# Patient Record
Sex: Male | Born: 1950 | Race: White | Hispanic: No | Smoking: Former smoker
Health system: Southern US, Community
[De-identification: ages and names within clinical notes are randomized; demographics above are authoritative.]

## PROBLEM LIST (undated history)

## (undated) DIAGNOSIS — I2510 Atherosclerotic heart disease of native coronary artery without angina pectoris: Secondary | ICD-10-CM

## (undated) DIAGNOSIS — I472 Ventricular tachycardia, unspecified: Secondary | ICD-10-CM

## (undated) DIAGNOSIS — F439 Reaction to severe stress, unspecified: Secondary | ICD-10-CM

## (undated) DIAGNOSIS — I259 Chronic ischemic heart disease, unspecified: Secondary | ICD-10-CM

## (undated) DIAGNOSIS — F419 Anxiety disorder, unspecified: Secondary | ICD-10-CM

## (undated) DIAGNOSIS — Z720 Tobacco use: Secondary | ICD-10-CM

## (undated) DIAGNOSIS — Z8679 Personal history of other diseases of the circulatory system: Secondary | ICD-10-CM

## (undated) DIAGNOSIS — E785 Hyperlipidemia, unspecified: Secondary | ICD-10-CM

## (undated) DIAGNOSIS — I10 Essential (primary) hypertension: Secondary | ICD-10-CM

## (undated) HISTORY — DX: Tobacco use: Z72.0

## (undated) HISTORY — DX: Ventricular tachycardia, unspecified: I47.20

## (undated) HISTORY — DX: Anxiety disorder, unspecified: F41.9

## (undated) HISTORY — DX: Reaction to severe stress, unspecified: F43.9

## (undated) HISTORY — DX: Hyperlipidemia, unspecified: E78.5

## (undated) HISTORY — DX: Ventricular tachycardia: I47.2

## (undated) HISTORY — DX: Essential (primary) hypertension: I10

## (undated) HISTORY — DX: Atherosclerotic heart disease of native coronary artery without angina pectoris: I25.10

## (undated) HISTORY — DX: Personal history of other diseases of the circulatory system: Z86.79

## (undated) HISTORY — DX: Chronic ischemic heart disease, unspecified: I25.9

---

## 1993-02-18 HISTORY — PX: OTHER SURGICAL HISTORY: SHX169

## 1994-02-18 HISTORY — PX: CARDIAC CATHETERIZATION: SHX172

## 2002-02-18 HISTORY — PX: CARDIAC CATHETERIZATION: SHX172

## 2002-10-04 ENCOUNTER — Ambulatory Visit (HOSPITAL_COMMUNITY): Admission: RE | Admit: 2002-10-04 | Discharge: 2002-10-05 | Payer: Self-pay | Admitting: Cardiology

## 2010-08-07 ENCOUNTER — Encounter: Payer: Self-pay | Admitting: *Deleted

## 2010-08-13 ENCOUNTER — Ambulatory Visit: Payer: Self-pay | Admitting: Nurse Practitioner

## 2010-08-13 ENCOUNTER — Other Ambulatory Visit: Payer: Self-pay | Admitting: *Deleted

## 2010-09-03 ENCOUNTER — Other Ambulatory Visit: Payer: Self-pay | Admitting: Cardiology

## 2010-09-03 MED ORDER — CITALOPRAM HYDROBROMIDE 20 MG PO TABS: 20.0000 mg | ORAL_TABLET | Freq: Every day | ORAL | Status: DC

## 2010-09-03 NOTE — Telephone Encounter (Signed)
Phone number disconnected, wrote on script to make app. 30 day given/Jodette Warden/ranger

## 2010-10-15 ENCOUNTER — Other Ambulatory Visit: Payer: Self-pay | Admitting: *Deleted

## 2010-10-15 MED ORDER — SIMVASTATIN 40 MG PO TABS: 40.0000 mg | ORAL_TABLET | Freq: Every day | ORAL | Status: DC

## 2010-10-15 MED ORDER — ATENOLOL 25 MG PO TABS: 25.0000 mg | ORAL_TABLET | Freq: Every day | ORAL | Status: DC

## 2010-10-15 MED ORDER — CITALOPRAM HYDROBROMIDE 20 MG PO TABS: 20.0000 mg | ORAL_TABLET | Freq: Every day | ORAL | Status: DC

## 2010-10-15 NOTE — Telephone Encounter (Signed)
lori np said to give 15 till he makes an app for f/u

## 2010-11-05 ENCOUNTER — Telehealth: Payer: Self-pay | Admitting: *Deleted

## 2010-11-05 NOTE — Telephone Encounter (Signed)
CALLED PHARM AND DECLINED REFILLS PER Corky Sox NP

## 2012-03-20 ENCOUNTER — Encounter (INDEPENDENT_AMBULATORY_CARE_PROVIDER_SITE_OTHER): Payer: BC Managed Care – PPO | Admitting: Ophthalmology

## 2012-03-20 DIAGNOSIS — H43819 Vitreous degeneration, unspecified eye: Secondary | ICD-10-CM

## 2012-03-20 DIAGNOSIS — H2510 Age-related nuclear cataract, unspecified eye: Secondary | ICD-10-CM

## 2012-03-20 DIAGNOSIS — H353290 Exudative age-related macular degeneration, unspecified eye, stage unspecified: Secondary | ICD-10-CM

## 2012-04-17 ENCOUNTER — Encounter (INDEPENDENT_AMBULATORY_CARE_PROVIDER_SITE_OTHER): Payer: BC Managed Care – PPO | Admitting: Ophthalmology

## 2012-04-17 DIAGNOSIS — H353290 Exudative age-related macular degeneration, unspecified eye, stage unspecified: Secondary | ICD-10-CM

## 2012-04-17 DIAGNOSIS — H2510 Age-related nuclear cataract, unspecified eye: Secondary | ICD-10-CM

## 2012-04-17 DIAGNOSIS — H43819 Vitreous degeneration, unspecified eye: Secondary | ICD-10-CM

## 2012-05-14 ENCOUNTER — Encounter (INDEPENDENT_AMBULATORY_CARE_PROVIDER_SITE_OTHER): Payer: BC Managed Care – PPO | Admitting: Ophthalmology

## 2012-05-14 DIAGNOSIS — H353290 Exudative age-related macular degeneration, unspecified eye, stage unspecified: Secondary | ICD-10-CM

## 2012-05-14 DIAGNOSIS — H43819 Vitreous degeneration, unspecified eye: Secondary | ICD-10-CM

## 2012-05-14 DIAGNOSIS — H2510 Age-related nuclear cataract, unspecified eye: Secondary | ICD-10-CM

## 2012-06-12 ENCOUNTER — Encounter (INDEPENDENT_AMBULATORY_CARE_PROVIDER_SITE_OTHER): Payer: BC Managed Care – PPO | Admitting: Ophthalmology

## 2012-06-12 DIAGNOSIS — H2510 Age-related nuclear cataract, unspecified eye: Secondary | ICD-10-CM

## 2012-06-12 DIAGNOSIS — H43819 Vitreous degeneration, unspecified eye: Secondary | ICD-10-CM

## 2012-06-12 DIAGNOSIS — H353290 Exudative age-related macular degeneration, unspecified eye, stage unspecified: Secondary | ICD-10-CM

## 2012-07-14 ENCOUNTER — Encounter (INDEPENDENT_AMBULATORY_CARE_PROVIDER_SITE_OTHER): Payer: BC Managed Care – PPO | Admitting: Ophthalmology

## 2012-07-17 ENCOUNTER — Encounter (INDEPENDENT_AMBULATORY_CARE_PROVIDER_SITE_OTHER): Payer: BC Managed Care – PPO | Admitting: Ophthalmology

## 2012-07-22 ENCOUNTER — Encounter (INDEPENDENT_AMBULATORY_CARE_PROVIDER_SITE_OTHER): Payer: BC Managed Care – PPO | Admitting: Ophthalmology

## 2012-07-31 ENCOUNTER — Encounter (INDEPENDENT_AMBULATORY_CARE_PROVIDER_SITE_OTHER): Payer: BC Managed Care – PPO | Admitting: Ophthalmology

## 2012-08-06 ENCOUNTER — Encounter (INDEPENDENT_AMBULATORY_CARE_PROVIDER_SITE_OTHER): Payer: BC Managed Care – PPO | Admitting: Ophthalmology

## 2012-08-17 ENCOUNTER — Encounter (INDEPENDENT_AMBULATORY_CARE_PROVIDER_SITE_OTHER): Payer: BC Managed Care – PPO | Admitting: Ophthalmology

## 2012-08-24 ENCOUNTER — Encounter (INDEPENDENT_AMBULATORY_CARE_PROVIDER_SITE_OTHER): Payer: BC Managed Care – PPO | Admitting: Ophthalmology

## 2012-08-28 ENCOUNTER — Encounter (INDEPENDENT_AMBULATORY_CARE_PROVIDER_SITE_OTHER): Payer: BC Managed Care – PPO | Admitting: Ophthalmology

## 2015-07-19 ENCOUNTER — Other Ambulatory Visit: Payer: Self-pay | Admitting: Acute Care

## 2015-07-19 DIAGNOSIS — F17210 Nicotine dependence, cigarettes, uncomplicated: Secondary | ICD-10-CM

## 2015-07-31 ENCOUNTER — Telehealth: Payer: Self-pay | Admitting: Acute Care

## 2015-07-31 NOTE — Telephone Encounter (Signed)
Patient is deferring on the lung cancer screening due to his high deductible cost. Appointments have been cancelled.Please re-refer once he has met his deductible. Thanks so much.

## 2015-07-31 NOTE — Telephone Encounter (Signed)
I checked with the patient's insurance today per his request after I got the CT approved to find out just how much it was going to cost him. The insurance would pay 80% after he had met his deductible which is 1,000.00. At this point he has not met any of his deductible and declined both appointments with Eric Form and the CT. The CT appt has been cancelled and I will cancel Sarah's appt also

## 2015-08-14 ENCOUNTER — Inpatient Hospital Stay: Admission: RE | Admit: 2015-08-14 | Payer: Self-pay | Source: Ambulatory Visit

## 2015-08-14 ENCOUNTER — Encounter: Payer: Self-pay | Admitting: Acute Care

## 2016-02-20 DIAGNOSIS — I10 Essential (primary) hypertension: Secondary | ICD-10-CM | POA: Diagnosis not present

## 2016-02-20 DIAGNOSIS — E785 Hyperlipidemia, unspecified: Secondary | ICD-10-CM | POA: Diagnosis not present

## 2016-03-04 DIAGNOSIS — I10 Essential (primary) hypertension: Secondary | ICD-10-CM | POA: Diagnosis not present

## 2016-03-04 DIAGNOSIS — M79674 Pain in right toe(s): Secondary | ICD-10-CM | POA: Diagnosis not present

## 2016-03-04 DIAGNOSIS — E785 Hyperlipidemia, unspecified: Secondary | ICD-10-CM | POA: Diagnosis not present

## 2016-03-12 DIAGNOSIS — I10 Essential (primary) hypertension: Secondary | ICD-10-CM | POA: Diagnosis not present

## 2016-03-12 DIAGNOSIS — E785 Hyperlipidemia, unspecified: Secondary | ICD-10-CM | POA: Diagnosis not present

## 2016-05-07 DIAGNOSIS — I10 Essential (primary) hypertension: Secondary | ICD-10-CM | POA: Diagnosis not present

## 2016-05-07 DIAGNOSIS — E785 Hyperlipidemia, unspecified: Secondary | ICD-10-CM | POA: Diagnosis not present

## 2016-08-08 DIAGNOSIS — I10 Essential (primary) hypertension: Secondary | ICD-10-CM | POA: Diagnosis not present

## 2016-08-08 DIAGNOSIS — E785 Hyperlipidemia, unspecified: Secondary | ICD-10-CM | POA: Diagnosis not present

## 2016-08-08 DIAGNOSIS — E790 Hyperuricemia without signs of inflammatory arthritis and tophaceous disease: Secondary | ICD-10-CM | POA: Diagnosis not present

## 2016-10-16 DIAGNOSIS — H2513 Age-related nuclear cataract, bilateral: Secondary | ICD-10-CM | POA: Diagnosis not present

## 2016-11-11 DIAGNOSIS — E790 Hyperuricemia without signs of inflammatory arthritis and tophaceous disease: Secondary | ICD-10-CM | POA: Diagnosis not present

## 2016-11-11 DIAGNOSIS — E785 Hyperlipidemia, unspecified: Secondary | ICD-10-CM | POA: Diagnosis not present

## 2016-11-11 DIAGNOSIS — I10 Essential (primary) hypertension: Secondary | ICD-10-CM | POA: Diagnosis not present

## 2016-11-11 DIAGNOSIS — Z23 Encounter for immunization: Secondary | ICD-10-CM | POA: Diagnosis not present

## 2016-11-12 DIAGNOSIS — E785 Hyperlipidemia, unspecified: Secondary | ICD-10-CM | POA: Diagnosis not present

## 2017-03-14 DIAGNOSIS — I10 Essential (primary) hypertension: Secondary | ICD-10-CM | POA: Diagnosis not present

## 2017-03-14 DIAGNOSIS — E785 Hyperlipidemia, unspecified: Secondary | ICD-10-CM | POA: Diagnosis not present

## 2017-03-14 DIAGNOSIS — Z125 Encounter for screening for malignant neoplasm of prostate: Secondary | ICD-10-CM | POA: Diagnosis not present

## 2017-03-14 DIAGNOSIS — E790 Hyperuricemia without signs of inflammatory arthritis and tophaceous disease: Secondary | ICD-10-CM | POA: Diagnosis not present

## 2017-09-16 DIAGNOSIS — Z23 Encounter for immunization: Secondary | ICD-10-CM | POA: Diagnosis not present

## 2017-09-16 DIAGNOSIS — Z119 Encounter for screening for infectious and parasitic diseases, unspecified: Secondary | ICD-10-CM | POA: Diagnosis not present

## 2017-09-16 DIAGNOSIS — E790 Hyperuricemia without signs of inflammatory arthritis and tophaceous disease: Secondary | ICD-10-CM | POA: Diagnosis not present

## 2017-09-16 DIAGNOSIS — E785 Hyperlipidemia, unspecified: Secondary | ICD-10-CM | POA: Diagnosis not present

## 2017-09-16 DIAGNOSIS — I10 Essential (primary) hypertension: Secondary | ICD-10-CM | POA: Diagnosis not present

## 2017-10-01 DIAGNOSIS — Z23 Encounter for immunization: Secondary | ICD-10-CM | POA: Diagnosis not present

## 2017-10-01 DIAGNOSIS — Z0000 Encounter for general adult medical examination without abnormal findings: Secondary | ICD-10-CM | POA: Diagnosis not present

## 2017-11-11 ENCOUNTER — Other Ambulatory Visit: Payer: Self-pay | Admitting: Acute Care

## 2017-11-11 DIAGNOSIS — F17210 Nicotine dependence, cigarettes, uncomplicated: Secondary | ICD-10-CM

## 2017-11-11 DIAGNOSIS — Z122 Encounter for screening for malignant neoplasm of respiratory organs: Secondary | ICD-10-CM

## 2017-11-21 ENCOUNTER — Ambulatory Visit (INDEPENDENT_AMBULATORY_CARE_PROVIDER_SITE_OTHER)
Admission: RE | Admit: 2017-11-21 | Discharge: 2017-11-21 | Disposition: A | Discharge status: Home / Self Care | Payer: 59 | Source: Ambulatory Visit | Attending: Acute Care | Admitting: Acute Care

## 2017-11-21 ENCOUNTER — Encounter: Payer: Self-pay | Admitting: Acute Care

## 2017-11-21 ENCOUNTER — Ambulatory Visit (INDEPENDENT_AMBULATORY_CARE_PROVIDER_SITE_OTHER): Payer: 59 | Admitting: Acute Care

## 2017-11-21 DIAGNOSIS — F17210 Nicotine dependence, cigarettes, uncomplicated: Secondary | ICD-10-CM | POA: Diagnosis not present

## 2017-11-21 DIAGNOSIS — Z122 Encounter for screening for malignant neoplasm of respiratory organs: Secondary | ICD-10-CM

## 2017-11-21 NOTE — Progress Notes (Signed)
Shared Decision Making Visit Lung Cancer Screening Program [phone])   Eligibility:  Age 67 y.o.  Pack Years Smoking History Calculation 37 pack year smoking history (# packs/per year x # years smoked)  Recent History of coughing up blood  no  Unexplained weight loss? no ( >Than 15 pounds within the last 6 months )  Prior History Lung / other cancer no (Diagnosis within the last 5 years already requiring surveillance chest CT Scans).  Smoking Status Current Smoker  Former Smokers: Years since quit: NA  Quit Date: NA  Visit Components:  Discussion included one or more decision making aids. yes  Discussion included risk/benefits of screening. yes  Discussion included potential follow up diagnostic testing for abnormal scans. yes  Discussion included meaning and risk of over diagnosis. yes  Discussion included meaning and risk of False Positives. yes  Discussion included meaning of total radiation exposure. yes  Counseling Included:  Importance of adherence to annual lung cancer LDCT screening. yes  Impact of comorbidities on ability to participate in the program. yes  Ability and willingness to under diagnostic treatment. yes  Smoking Cessation Counseling:  Current Smokers:   Discussed importance of smoking cessation. yes  Information about tobacco cessation classes and interventions provided to patient. yes  Patient provided with "ticket" for LDCT Scan. yes  Symptomatic Patient. no  Counseling  Diagnosis Code: Tobacco Use Z72.0  Asymptomatic Patient yes  Counseling (Intermediate counseling: > three minutes counseling) U0454  Former Smokers:   Discussed the importance of maintaining cigarette abstinence. yes  Diagnosis Code: Personal History of Nicotine Dependence. U98.119  Information about tobacco cessation classes and interventions provided to patient. Yes  Patient provided with "ticket" for LDCT Scan. yes  Written Order for Lung Cancer  Screening with LDCT placed in Epic. Yes (CT Chest Lung Cancer Screening Low Dose W/O CM) [id number] Z12.2-Screening of respiratory organs Z87.891-Personal history of nicotine dependence  I have spent 25 minutes of face to face time with Mr. Mays discussing the risks and benefits of lung cancer screening. We viewed a power point together that explained in detail the above noted topics. We paused at intervals to allow for questions to be asked and answered to ensure understanding.We discussed that the single most powerful action that he can take to decrease his risk of developing lung cancer is to quit smoking. We discussed whether or not he is ready to commit to setting a quit date. We discussed options for tools to aid in quitting smoking including nicotine replacement therapy, non-nicotine medications, support groups, Quit Smart classes, and behavior modification. We discussed that often times setting smaller, more achievable goals, such as eliminating 1 cigarette a day for a week and then 2 cigarettes a day for a week can be helpful in slowly decreasing the number of cigarettes smoked. This allows for a sense of accomplishment as well as providing a clinical benefit. I gave him the " Be Stronger Than Your Excuses" card with contact information for community resources, classes, free nicotine replacement therapy, and access to mobile apps, text messaging, and on-line smoking cessation help. I have also given him my card and contact information in the event he needs to contact me. We discussed the time and location of the scan, and that either Abigail Miyamoto RN or I will call with the results within 24-48 hours of receiving them. I have offered him  a copy of the power point we viewed  as a resource in the event they need reinforcement of  the concepts we discussed today in the office. The patient verbalized understanding of all of  the above and had no further questions upon leaving the office. They have my  contact information in the event they have any further questions.  I spent 4 minutes counseling on smoking cessation and the health risks of continued tobacco abuse.  I explained to the patient that there has been a high incidence of coronary artery disease noted on these exams. I explained that this is a non-gated exam therefore degree or severity cannot be determined. This patient is on statin therapy. I have asked the patient to follow-up with their PCP regarding any incidental finding of coronary artery disease and management with diet or medication as their PCP  feels is clinically indicated. The patient verbalized understanding of the above and had no further questions upon completion of the visit.      Bevelyn Ngo, NP 11/21/2017 3:30 PM

## 2017-11-28 NOTE — Progress Notes (Signed)
Called spoke with patient, advised of LDCT results / recs as stated by Maralyn Sago NP.  Pt verbalized understanding and denied any questions.  Results faxed to PCP.  Angelique Blonder to order LDCT in 11/2018.

## 2017-12-02 ENCOUNTER — Other Ambulatory Visit: Payer: Self-pay | Admitting: Acute Care

## 2017-12-02 DIAGNOSIS — Z122 Encounter for screening for malignant neoplasm of respiratory organs: Secondary | ICD-10-CM

## 2017-12-02 DIAGNOSIS — F17210 Nicotine dependence, cigarettes, uncomplicated: Secondary | ICD-10-CM

## 2017-12-02 DIAGNOSIS — Z87891 Personal history of nicotine dependence: Secondary | ICD-10-CM

## 2017-12-08 DIAGNOSIS — H2513 Age-related nuclear cataract, bilateral: Secondary | ICD-10-CM | POA: Diagnosis not present

## 2018-03-27 DIAGNOSIS — E785 Hyperlipidemia, unspecified: Secondary | ICD-10-CM | POA: Diagnosis not present

## 2018-03-27 DIAGNOSIS — I10 Essential (primary) hypertension: Secondary | ICD-10-CM | POA: Diagnosis not present

## 2018-03-27 DIAGNOSIS — E790 Hyperuricemia without signs of inflammatory arthritis and tophaceous disease: Secondary | ICD-10-CM | POA: Diagnosis not present

## 2018-03-27 DIAGNOSIS — Z125 Encounter for screening for malignant neoplasm of prostate: Secondary | ICD-10-CM | POA: Diagnosis not present

## 2018-04-03 DIAGNOSIS — Z1211 Encounter for screening for malignant neoplasm of colon: Secondary | ICD-10-CM | POA: Diagnosis not present

## 2018-06-08 DIAGNOSIS — H2513 Age-related nuclear cataract, bilateral: Secondary | ICD-10-CM | POA: Diagnosis not present

## 2018-11-19 ENCOUNTER — Ambulatory Visit: Payer: Self-pay | Admitting: Cardiology

## 2018-11-20 ENCOUNTER — Ambulatory Visit: Payer: Medicare Other

## 2018-11-20 ENCOUNTER — Other Ambulatory Visit: Payer: Self-pay

## 2018-11-20 ENCOUNTER — Ambulatory Visit (INDEPENDENT_AMBULATORY_CARE_PROVIDER_SITE_OTHER): Payer: Medicare Other | Admitting: Cardiology

## 2018-11-20 ENCOUNTER — Encounter: Payer: Self-pay | Admitting: Cardiology

## 2018-11-20 VITALS — BP 165/82 | HR 48 | Ht 72.0 in | Wt 242.1 lb

## 2018-11-20 DIAGNOSIS — R002 Palpitations: Secondary | ICD-10-CM

## 2018-11-20 DIAGNOSIS — I2510 Atherosclerotic heart disease of native coronary artery without angina pectoris: Secondary | ICD-10-CM

## 2018-11-20 DIAGNOSIS — I10 Essential (primary) hypertension: Secondary | ICD-10-CM | POA: Diagnosis not present

## 2018-11-20 DIAGNOSIS — E782 Mixed hyperlipidemia: Secondary | ICD-10-CM

## 2018-11-20 DIAGNOSIS — R9431 Abnormal electrocardiogram [ECG] [EKG]: Secondary | ICD-10-CM | POA: Insufficient documentation

## 2018-11-20 DIAGNOSIS — H3530 Unspecified macular degeneration: Secondary | ICD-10-CM | POA: Insufficient documentation

## 2018-11-20 DIAGNOSIS — Z136 Encounter for screening for cardiovascular disorders: Secondary | ICD-10-CM

## 2018-11-20 DIAGNOSIS — E785 Hyperlipidemia, unspecified: Secondary | ICD-10-CM | POA: Insufficient documentation

## 2018-11-20 NOTE — Progress Notes (Addendum)
Patient referred by Vernie Shanks, MD for abnormal EKG, palpitations  Subjective:   Eric Jackson, male    DOB: [birth year], 68 y.o.   MRN: [id number]   Chief Complaint  Patient presents with  . Palpitations  . Abnormal ECG  . New Patient (Initial Visit)    HPI  68 year old Caucasian male with coronary artery disease status post MI and stenting in 1990s, hyperlipidemia, hyperglycemia, obesity, former smoker, referred to establish cardiac care and evaluate and manage palpitations.  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  Lives fairly sedentary lifestyle and does not do any regular exercise.  He denies chest pain, shortness of breath, palpitations, leg edema, orthopnea, PND, TIA/syncope.  However, he has noticed palpitations recently, lasting for 1 hour.  First episode occurred after he heard the news of demise of Kyra Leyland.  He has had few episodes since then.  Palpitations are not associated with chest pain, shortness of breath or any other symptoms.  Patient has known coronary artery disease with medically treated MI in 1990s, followed by stenting 2 years later.  I do not have the records available.   Patient is considering moving to [place] to be with his significant other, who unfortunately had to move the due to loss of job during cope with pandemic and to be with rest of her family.  Recently, he endorses a lot of stress related to his part-time work which has turned into a full-time work due to [employer] related IT needs.   Past Medical History:  Diagnosis Date  . Anxiety   . Coronary artery disease   . History of atherosclerotic cardiovascular disease   . Hyperlipemia   . Hypertension   . Ischemic heart disease   . Situational stress   . Tobacco abuse   . Ventricular tachycardia [facility])      Past Surgical History:  Procedure Laterality Date  . CARDIAC CATHETERIZATION  1996   normal EF -- normal LV function -- minimal coronary irregularities with  no significant focal disease   . CARDIAC CATHETERIZATION  2004   Est. EF of 55-60%placement of x2 stents to the right coronary artery     Social History   Socioeconomic History  . Marital status: Single    Spouse name: Not on file  . Number of children: Not on file  . Years of education: Not on file  . Highest education level: Not on file  Occupational History  . Not on file  Social Needs  . Financial resource strain: Not on file  . Food insecurity    Worry: Not on file    Inability: Not on file  . Transportation needs    Medical: Not on file    Non-medical: Not on file  Tobacco Use  . Smoking status: Current Every Day Smoker    Packs/day: 1.00    Years: 38.00    Pack years: 38.00    Types: Cigarettes  . Smokeless tobacco: Never Used  . Tobacco comment: Starting Chantix  Substance and Sexual Activity  . Alcohol use: Not on file  . Drug use: Not on file  . Sexual activity: Not on file  Lifestyle  . Physical activity    Days per week: Not on file    Minutes per session: Not on file  . Stress: Not on file  Relationships  . Social Herbalist on phone: Not on file    Gets together: Not on file  Attends religious service: Not on file    Active member of club or organization: Not on file    Attends meetings of clubs or organizations: Not on file    Relationship status: Not on file  . Intimate partner violence    Fear of current or ex partner: Not on file    Emotionally abused: Not on file    Physically abused: Not on file    Forced sexual activity: Not on file  Other Topics Concern  . Not on file  Social History Narrative  . Not on file     Family History  Problem Relation Age of Onset  . Heart failure Sister        open heart surgery  . Kidney disease Brother   . Heart disease Brother   . Coronary artery disease Mother      Current Outpatient Medications on File Prior to Visit  Medication Sig Dispense Refill  . Ascorbic Acid (VITAMIN C  PO) Take 1 tablet by mouth daily.      Marland Kitchen aspirin 81 MG tablet Take 81 mg by mouth daily.      Marland Kitchen atenolol (TENORMIN) 25 MG tablet Take 1 tablet (25 mg total) by mouth daily. 15 tablet 0  . citalopram (CELEXA) 20 MG tablet Take 1 tablet (20 mg total) by mouth daily. 15 tablet 0  . Pomegranate 250 MG CAPS Take 1 capsule by mouth. Occasionally      . simvastatin (ZOCOR) 40 MG tablet Take 1 tablet (40 mg total) by mouth at bedtime. 15 tablet 0   No current facility-administered medications on file prior to visit.     Cardiovascular studies:  EKG 11/20/2018: Sinus rhythm 51 bpm. Old inferior infarct Lateral T wave inversion, consider ischemia.   CT Chest lung cancer screening 11/2017: CT 1. Lung-RADS 2, benign appearance or behavior. Continue annual screening with low-dose chest CT without contrast in 12 months. 2. Aortic atherosclerosis (ICD10-170.0). Three-vessel coronary artery calcification. 3.  Emphysema (ICD10-J43.9).    Recent labs: 07/24/2018: Glucose 102. BUN/Cr 13/1.08. eGFR 68. Na/K 138/5.0.Rest of the CMP normal.  Chol 123, TG 130, HDL 37, LDL 60. Hb1C 5.6%  03/2018: Chol 131, TG 128, HDL 36, LDL 69.   Review of Systems  Constitution: Negative for decreased appetite, malaise/fatigue, weight gain and weight loss.  HENT: Negative for congestion.   Eyes: Negative for visual disturbance.  Cardiovascular: Positive for palpitations. Negative for chest pain, dyspnea on exertion, leg swelling and syncope.  Respiratory: Negative for cough.   Endocrine: Negative for cold intolerance.  Hematologic/Lymphatic: Does not bruise/bleed easily.  Skin: Negative for itching and rash.  Musculoskeletal: Negative for myalgias.  Gastrointestinal: Negative for abdominal pain, nausea and vomiting.  Genitourinary: Negative for dysuria.  Neurological: Negative for dizziness and weakness.  Psychiatric/Behavioral: The patient is not nervous/anxious.   All other systems reviewed and are  negative.        Vitals:   11/20/18 0925 11/20/18 0944  BP: (!) 165/87 (!) 165/82  Pulse: (!) 51 (!) 48  SpO2: 95%      Body mass index is 32.83 kg/m. Filed Weights   11/20/18 0925  Weight: 109.8 kg     Objective:   Physical Exam  Constitutional: He is oriented to person, place, and time. He appears well-developed and well-nourished. No distress.  HENT:  Head: Normocephalic and atraumatic.  Eyes: Pupils are equal, round, and reactive to light. Conjunctivae are normal.  Neck: No JVD present.  Cardiovascular: Normal rate, regular  rhythm and intact distal pulses.  No murmur heard. Pulmonary/Chest: Effort normal and breath sounds normal. He has no wheezes. He has no rales.  Abdominal: Soft. Bowel sounds are normal. There is no rebound.  Musculoskeletal:        General: No edema.  Lymphadenopathy:    He has no cervical adenopathy.  Neurological: He is alert and oriented to person, place, and time. No cranial nerve deficit.  Skin: Skin is warm and dry.  Psychiatric: He has a normal mood and affect.  Nursing note and vitals reviewed.         Assessment & Recommendations:   68 year old Caucasian male with coronary artery disease status post MI and stenting in 1990s, former smoker, referred to establish cardiac care and evaluate and manage palpitations.  Palpitations: Need to evaluate for atrial fibrillation.  Stress may be an important precipitating factor. Recommend echocardiogram and 4-week event monitor.  Coronary artery disease without angina: EKG suggest old inferior infarct.  No angina symptoms at this time.  Continue aspirin, statin, atenolol.  Lipid panel well controlled.  AAA screening: >65 yr with smoking history. Will check [place].  Hypertension: Blood pressure elevated at today's visit.  Patient reports much lower blood pressure at baseline.  No changes made today.  Will need repeat check in near future.   I recommended heart healthy diet that includes  more of lean meat, fish, fruits, vegetables, nuts, olives (Meditarranean diet) and less of processed food, red meat, dairy, cheese, fried food, sugar, excess salt. Recommend daily exercise with at least 30 min walking or 15 min high intensity exercise most days a week.   Thank you for referring the patient to [place]. Please feel free to contact with any questions.  Nigel Mormon, MD [facility] Pager: [phone] Office: [phone] If no answer Cell [phone]

## 2018-11-20 NOTE — Addendum Note (Signed)
Addended by: Nigel Mormon on: 11/20/2018 04:22 PM   Modules accepted: Orders

## 2018-11-22 ENCOUNTER — Telehealth: Payer: Self-pay | Admitting: Cardiology

## 2018-11-22 ENCOUNTER — Encounter: Payer: Self-pay | Admitting: Cardiology

## 2018-11-22 DIAGNOSIS — I4892 Unspecified atrial flutter: Secondary | ICD-10-CM

## 2018-11-22 MED ORDER — APIXABAN 5 MG PO TABS: 5.0000 mg | ORAL_TABLET | Freq: Two times a day (BID) | ORAL | 2 refills | Status: DC

## 2018-11-22 MED ORDER — DILTIAZEM HCL 30 MG PO TABS: 30.0000 mg | ORAL_TABLET | Freq: Three times a day (TID) | ORAL | 2 refills | Status: DC | PRN

## 2018-11-22 NOTE — Telephone Encounter (Signed)
Received notification from [employer] event monitor regarding critical rhythm. Reviewed rhythm strip.  Atrial flutter with variable conduction after ventricular rate of 140 bpm noted.  This correlated with patient symptoms of palpitations.  I personally spoke with the patient and discussed the findings and implications.  Currently, his palpitations have improved.  Recommend diltiazem 30 mg as needed for episodes of palpitations.  Continue atenolol 25 mg daily.  CHA2DS2VASc score 3, annual stroke risk 3.2%. Discussed benefits and risks of anticoagulation.  Recommend starting Eliquis 5 mg twice daily.  Also recommend referral for sleep study given possibility of obstructive sleep apnea is triggering factor.  Patient is currently scheduled to have echocardiogram on 12/23/2018.  We will try to expedite this.  Currently, he does not have any symptoms of ischemia.  We will hold off stress test.  Finally, I believe he is paroxysmal atrial flutter could be amenable to ablation.  Will refer him to electrophysiology.  Of note, patient is planning on leaving for [facility] later this month for a week.  He will likely moved to [place] permanently in January 2021.  Nigel Mormon, MD

## 2018-11-22 NOTE — Progress Notes (Signed)
Atrial flutter with variable conduction  11/22/2018 7:30 AM

## 2018-11-23 NOTE — Telephone Encounter (Signed)
Echo and carotid has been moved.- can you please check the referral part of the message please? Thanks!!

## 2018-11-25 ENCOUNTER — Encounter: Payer: Self-pay | Admitting: Neurology

## 2018-11-25 ENCOUNTER — Ambulatory Visit: Payer: Medicare Other | Admitting: Neurology

## 2018-11-25 ENCOUNTER — Other Ambulatory Visit: Payer: Self-pay

## 2018-11-25 VITALS — BP 143/93 | HR 70 | Ht 72.0 in | Wt 241.0 lb

## 2018-11-25 DIAGNOSIS — R002 Palpitations: Secondary | ICD-10-CM

## 2018-11-25 DIAGNOSIS — E669 Obesity, unspecified: Secondary | ICD-10-CM

## 2018-11-25 DIAGNOSIS — I2510 Atherosclerotic heart disease of native coronary artery without angina pectoris: Secondary | ICD-10-CM

## 2018-11-25 DIAGNOSIS — F17200 Nicotine dependence, unspecified, uncomplicated: Secondary | ICD-10-CM

## 2018-11-25 DIAGNOSIS — R0683 Snoring: Secondary | ICD-10-CM

## 2018-11-25 DIAGNOSIS — I4892 Unspecified atrial flutter: Secondary | ICD-10-CM | POA: Diagnosis not present

## 2018-11-25 DIAGNOSIS — R351 Nocturia: Secondary | ICD-10-CM

## 2018-11-25 NOTE — Patient Instructions (Signed)

## 2018-11-25 NOTE — Progress Notes (Addendum)
Subjective:    Patient ID: Wynetta EmeryRyan W Weinrich is a 68 y.o. male.  HPI     Huston FoleySaima Kaira Stringfield, MD, PhD [facility] [address], [address] [address] [place], [place] [address]  Dear Dr. Rosemary HolmsPatwardhan,  I saw your patient, Margaretha SheffieldRyan Bertelson, upon your kind request to my sleep clinic today for initial consultation of his sleep disorder, in particular, concern for underlying obstructive sleep apnea.  The patient is unaccompanied today.  As you know, Mr. Pricilla Holmucker is a 68 year old right-handed gentleman with an underlying medical history of hypertension, hyperlipidemia, palpitations, coronary artery disease, smoking with recent cessation, macular degeneration bilaterally, history of ventricular tachycardia, anxiety and obesity, who reports snoring and palpitations, recent episode of atrial flutter.  He is currently on a 30-day heart monitor and had one episode of atrial flutter thus far last week.  I reviewed your office note from 11/20/2018.  His Epworth sleepiness score is 3 out of 24, fatigue severity score is 15 out of 63.  His bedtime is generally between 1030 and 11, rise time between 6 and 7.  He used to be Catering managerthe director of IT at [employer], he is working part-time now, generally between 9 AM and 2 PM.  He is divorced, his girlfriend of 10 years recently moved to [place] because of her work.  He may be moving up to [place] at some point.  His son currently stays with him, he is 66 years old.  He has a daughter who is 3436, in [place], just had a baby in August, his first grandchild.  The patient quit smoking in March 2020.  He uses occasional nicotine lozenges.  He drinks caffeine in the form of coffee, 2 to 3 cups/day and has reduced his caffeine intake.  He drinks alcohol in the form of mixed drink, 2/day on average.  He has no obvious family history of sleep apnea.  He denies recurrent morning headaches.  He has nocturia about once per average night.  Since February 2020 has lost about 12 pounds.   He had a tonsillectomy as a child, age 89.  His Past Medical History Is Significant For: Past Medical History:  Diagnosis Date  . Anxiety   . Coronary artery disease   . History of atherosclerotic cardiovascular disease   . Hyperlipemia   . Hypertension   . Ischemic heart disease   . Situational stress   . Tobacco abuse   . Ventricular tachycardia (HCC)     His Past Surgical History Is Significant For: Past Surgical History:  Procedure Laterality Date  . artery  1995   artery collasped 1 sstent placed   . CARDIAC CATHETERIZATION  1996   normal EF -- normal LV function -- minimal coronary irregularities with no significant focal disease   . CARDIAC CATHETERIZATION  2004   Est. EF of 55-60%placement of x2 stents to the right coronary artery    His Family History Is Significant For: Family History  Problem Relation Age of Onset  . Heart failure Sister        open heart surgery  . Kidney disease Brother   . Heart disease Brother   . Coronary artery disease Mother   . Heart attack Father     His Social History Is Significant For: Social History   Socioeconomic History  . Marital status: Divorced    Spouse name: Not on file  . Number of children: 2  . Years of education: Not on file  . Highest education  level: Not on file  Occupational History  . Not on file  Social Needs  . Financial resource strain: Not on file  . Food insecurity    Worry: Not on file    Inability: Not on file  . Transportation needs    Medical: Not on file    Non-medical: Not on file  Tobacco Use  . Smoking status: Former Smoker    Packs/day: 1.00    Years: 38.00    Pack years: 38.00    Types: Cigarettes    Quit date: 04/21/2018    Years since quitting: 0.5  . Smokeless tobacco: Never Used  Substance and Sexual Activity  . Alcohol use: Yes    Comment: occ  . Drug use: Never  . Sexual activity: Not on file  Lifestyle  . Physical activity    Days per week: Not on file    Minutes per  session: Not on file  . Stress: Not on file  Relationships  . Social Musician on phone: Not on file    Gets together: Not on file    Attends religious service: Not on file    Active member of club or organization: Not on file    Attends meetings of clubs or organizations: Not on file    Relationship status: Not on file  Other Topics Concern  . Not on file  Social History Narrative  . Not on file    His Allergies Are:  No Known Allergies:   His Current Medications Are:  Outpatient Encounter Medications as of 11/25/2018  Medication Sig  . allopurinol (ZYLOPRIM) 100 MG tablet Take 200 mg by mouth daily.   Marland Kitchen amLODipine (NORVASC) 10 MG tablet Take 10 mg by mouth daily.  Marland Kitchen apixaban (ELIQUIS) 5 MG TABS tablet Take 1 tablet (5 mg total) by mouth 2 (two) times daily.  . Ascorbic Acid (VITAMIN C PO) Take 1 tablet by mouth daily.    Marland Kitchen aspirin 81 MG tablet Take 81 mg by mouth daily.    Marland Kitchen atenolol (TENORMIN) 50 MG tablet Take 50 mg by mouth daily.  Marland Kitchen atorvastatin (LIPITOR) 80 MG tablet Take 80 mg by mouth daily.  . colchicine 0.6 MG tablet Take 0.6 mg by mouth 2 (two) times daily as needed.  . diltiazem (CARDIZEM) 30 MG tablet Take 1 tablet (30 mg total) by mouth every 8 (eight) hours as needed.  . [DISCONTINUED] atenolol (TENORMIN) 25 MG tablet Take 1 tablet (25 mg total) by mouth daily. (Patient taking differently: Take 50 mg by mouth daily. )   No facility-administered encounter medications on file as of 11/25/2018.   :  Review of Systems:  Out of a complete 14 point review of systems, all are reviewed and negative with the exception of these symptoms as listed below: Review of Systems  Neurological:       Pt presents today to discuss his sleep. Pt has never had a sleep study but does endorse snoring.  Epworth Sleepiness Scale 0= would never doze 1= slight chance of dozing 2= moderate chance of dozing 3= high chance of dozing  Sitting and reading: 0 Watching TV:  1 Sitting inactive in a public place (ex. Theater or meeting): 0 As a passenger in a car for an hour without a break: 0 Lying down to rest in the afternoon: 2 Sitting and talking to someone: 0 Sitting quietly after lunch (no alcohol): 0 In a car, while stopped in traffic: 0 Total: 3  Objective:  Neurological Exam  Physical Exam Physical Examination:   Vitals:   11/25/18 1520  BP: (!) 143/93  Pulse: 70    General Examination: The patient is a very pleasant 68 y.o. male in no acute distress. He appears well-developed and well-nourished and well groomed.   HEENT: Normocephalic, atraumatic, pupils are equal, round and reactive to light. Mild bilateral cataracts noted, he has corrective eyeglasses in place, extraocular tracking is well preserved, hearing is grossly intact.  Face is symmetric with normal facial animation.  Speech is clear without dysarthria, hypophonia or voice tremor. Neck with FROM. There are no carotid bruits on auscultation. Oropharynx exam reveals: mild mouth dryness, Marginal dental hygiene with essentially edentulous state on the top and few teeth left on the bottom with mild airway crowding secondary to redundant soft palate and thicker tongue.  Tongue protrudes centrally in palate elevates symmetrically.  Tonsils are absent.  Neck circumference is 18 inches.  Chest: Clear to auscultation without wheezing, rhonchi or crackles noted.  Heart: S1+S2+0, regular and normal without murmurs, rubs or gallops noted. Heart monitor lead in place.  Abdomen: Soft, non-tender and non-distended with normal bowel sounds appreciated on auscultation.  Extremities: There is no pitting edema in the distal lower extremities bilaterally.  Skin: Warm and dry without trophic changes noted.  Musculoskeletal: exam reveals no obvious joint deformities, tenderness or joint swelling or erythema.   Neurologically:  Mental status: The patient is awake, alert and oriented in all 4  spheres. His immediate and remote memory, attention, language skills and fund of knowledge are appropriate. There is no evidence of aphasia, agnosia, apraxia or anomia. Speech is clear with normal prosody and enunciation. Thought process is linear. Mood is normal and affect is normal.  Cranial nerves II - XII are as described above under HEENT exam. In addition: shoulder shrug is normal with equal shoulder height noted. Motor exam: Normal bulk, strength and tone is noted. There is no drift, tremor or rebound. Romberg is negative. Fine motor skills and coordination: grossly intact.  Cerebellar testing: No dysmetria or intention tremor on finger to nose testing. Heel to shin is unremarkable bilaterally. There is no truncal or gait ataxia.  Sensory exam: intact to light touch in the upper and lower extremities.  Gait, station and balance: He stands easily. No veering to one side is noted. No leaning to one side is noted. Posture is age-appropriate and stance is narrow based. Gait shows normal stride length and normal pace. No problems turning are noted. Tandem walk is unremarkable.   Assessment and plan:   In summary, MELVIN WHITEFORD is a very pleasant 68 y.o.-year old male with an underlying medical history of hypertension, hyperlipidemia, palpitations, coronary artery disease, smoking with recent cessation, macular degeneration bilaterally, history of ventricular tachycardia, anxiety and obesity, whose history and physical exam are concerning for obstructive sleep apnea (OSA). I had a long chat with the patient about my findings and the diagnosis of OSA, its prognosis and treatment options. We talked about medical treatments, surgical interventions and non-pharmacological approaches. I explained in particular the risks and ramifications of untreated moderate to severe OSA, especially with respect to developing cardiovascular disease down the Road, including congestive heart failure, difficult to treat  hypertension, cardiac arrhythmias, or stroke. Even type 2 diabetes has, in part, been linked to untreated OSA. Symptoms of untreated OSA include daytime sleepiness, memory problems, mood irritability and mood disorder such as depression and anxiety, lack of energy, as well as recurrent  headaches, especially morning headaches. We talked about ongoing smoking cessation and trying to maintain a healthy lifestyle in general, as well as the importance of weight control. We also talked about the importance of good sleep hygiene. I recommended the following at this time: sleep study.   I explained the sleep test procedure to the patient and also outlined possible surgical and non-surgical treatment options of OSA, including the use of a custom-made dental device (which would require a referral to a specialist dentist or oral surgeon), upper airway surgical options, such as traditional UPPP or a novel less invasive surgical option in the form of Inspire hypoglossal nerve stimulation (which would involve a referral to an ENT surgeon). I also explained the CPAP treatment option to the patient, who indicated that he would be willing to try CPAP if the need arises. I explained the importance of being compliant with PAP treatment, not only for insurance purposes but primarily to improve His symptoms, and for the patient's long term health benefit, including to reduce His cardiovascular risks. I answered all his questions today and the patient was in agreement. I plan to see him back after the sleep study is completed and encouraged him to call with any interim questions, concerns, problems or updates.   Thank you very much for allowing me to participate in the care of this nice patient. If I can be of any further assistance to you please do not hesitate to call me at [phone].  Sincerely,   Star Age, MD, PhD

## 2018-12-03 ENCOUNTER — Ambulatory Visit (INDEPENDENT_AMBULATORY_CARE_PROVIDER_SITE_OTHER): Payer: Medicare Other

## 2018-12-03 ENCOUNTER — Other Ambulatory Visit: Payer: Self-pay

## 2018-12-03 DIAGNOSIS — R0989 Other specified symptoms and signs involving the circulatory and respiratory systems: Secondary | ICD-10-CM | POA: Diagnosis not present

## 2018-12-03 DIAGNOSIS — I2510 Atherosclerotic heart disease of native coronary artery without angina pectoris: Secondary | ICD-10-CM | POA: Diagnosis not present

## 2018-12-03 DIAGNOSIS — R002 Palpitations: Secondary | ICD-10-CM | POA: Diagnosis not present

## 2018-12-03 DIAGNOSIS — Z136 Encounter for screening for cardiovascular disorders: Secondary | ICD-10-CM

## 2018-12-03 DIAGNOSIS — I10 Essential (primary) hypertension: Secondary | ICD-10-CM | POA: Diagnosis not present

## 2018-12-06 NOTE — Progress Notes (Signed)
Patient referred by Vernie Shanks, MD for abnormal EKG, palpitations  Subjective:   Eric Jackson, male    DOB: 09-26-1950, 68 y.o.   MRN: [id number]   Chief Complaint  Patient presents with  . Coronary Artery Disease  . Follow-up  . Results    echo    HPI  68 year old Caucasian male with coronary artery disease status post MI and stenting in 1990s, paroxysmal Afib (new diagnosis), hyperlipidemia, hyperglycemia, obesity, former smoker.  Afib was diagnosed on event monitor. Patient is well verse with his palpitations symptoms, and reports 3 episodes in past month or so. He is leaving for [facility] next week, will be back in early November. He intends to move to [facility] in February 2021, to be with his significant other.   Blood pressure is elevated today. He has been out of amlodipine.   Past Medical History:  Diagnosis Date  . Anxiety   . Coronary artery disease   . History of atherosclerotic cardiovascular disease   . Hyperlipemia   . Hypertension   . Ischemic heart disease   . Situational stress   . Tobacco abuse   . Ventricular tachycardia [facility])      Past Surgical History:  Procedure Laterality Date  . artery  1995   artery collasped 1 sstent placed   . CARDIAC CATHETERIZATION  1996   normal EF -- normal LV function -- minimal coronary irregularities with no significant focal disease   . CARDIAC CATHETERIZATION  2004   Est. EF of 55-60%placement of x2 stents to the right coronary artery     Social History   Socioeconomic History  . Marital status: Divorced    Spouse name: Not on file  . Number of children: 2  . Years of education: Not on file  . Highest education level: Not on file  Occupational History  . Not on file  Social Needs  . Financial resource strain: Not on file  . Food insecurity    Worry: Not on file    Inability: Not on file  . Transportation needs    Medical: Not on file    Non-medical: Not on file  Tobacco Use  . Smoking  status: Former Smoker    Packs/day: 1.00    Years: 38.00    Pack years: 38.00    Types: Cigarettes    Quit date: 04/21/2018    Years since quitting: 0.6  . Smokeless tobacco: Never Used  Substance and Sexual Activity  . Alcohol use: Yes    Comment: occ  . Drug use: Never  . Sexual activity: Not on file  Lifestyle  . Physical activity    Days per week: Not on file    Minutes per session: Not on file  . Stress: Not on file  Relationships  . Social Herbalist on phone: Not on file    Gets together: Not on file    Attends religious service: Not on file    Active member of club or organization: Not on file    Attends meetings of clubs or organizations: Not on file    Relationship status: Not on file  . Intimate partner violence    Fear of current or ex partner: Not on file    Emotionally abused: Not on file    Physically abused: Not on file    Forced sexual activity: Not on file  Other Topics Concern  . Not on file  Social History Narrative  .  Not on file     Family History  Problem Relation Age of Onset  . Heart failure Sister        open heart surgery  . Kidney disease Brother   . Heart disease Brother   . Coronary artery disease Mother   . Heart attack Father      Current Outpatient Medications on File Prior to Visit  Medication Sig Dispense Refill  . allopurinol (ZYLOPRIM) 100 MG tablet Take 200 mg by mouth daily.     Marland Kitchen amLODipine (NORVASC) 10 MG tablet Take 10 mg by mouth daily.    Marland Kitchen apixaban (ELIQUIS) 5 MG TABS tablet Take 1 tablet (5 mg total) by mouth 2 (two) times daily. 60 tablet 2  . Ascorbic Acid (VITAMIN C PO) Take 1 tablet by mouth daily.      Marland Kitchen aspirin 81 MG tablet Take 81 mg by mouth daily.      Marland Kitchen atenolol (TENORMIN) 50 MG tablet Take 50 mg by mouth daily.    Marland Kitchen atorvastatin (LIPITOR) 80 MG tablet Take 80 mg by mouth daily.    . colchicine 0.6 MG tablet Take 0.6 mg by mouth 2 (two) times daily as needed.    . diltiazem (CARDIZEM) 30 MG  tablet Take 1 tablet (30 mg total) by mouth every 8 (eight) hours as needed. 30 tablet 2   No current facility-administered medications on file prior to visit.     Cardiovascular studies:  EKG 12/07/2018: Sinus bradycardia 48 bpm. Nonspecific T-abnormality.  lead loss lead V3.  Echocardiogram 12/03/2018: Left ventricle cavity is normal in size. Moderate concentric hypertrophy of the left ventricle. Normal LV systolic function with visual EF 55-60%. Normal global wall motion. Doppler evidence of grade I (impaired) diastolic dysfunction, normal LAP.  Left atrial cavity is mildly dilated. Mild tricuspid regurgitation.  No evidence of pulmonary hypertension.  Abdominal Aortic Duplex  12/03/2018: No evidence of aneurysm. The maximum aorta (sac) diameter is 1.92 cm (dist). Diffuse calcific plaque observed in the proximal, mid and distal aorta. Normal flow velocities noted in the aorta and internal iliac arteries.  EKG 11/20/2018: Sinus rhythm 51 bpm. Old inferior infarct Lateral T wave inversion, consider ischemia.   CT Chest lung cancer screening 11/2017: CT 1. Lung-RADS 2, benign appearance or behavior. Continue annual screening with low-dose chest CT without contrast in 12 months. 2. Aortic atherosclerosis (ICD10-170.0). Three-vessel coronary artery calcification. 3.  Emphysema (ICD10-J43.9).    Recent labs: 07/24/2018: Glucose 102. BUN/Cr 13/1.08. eGFR 68. Na/K 138/5.0.Rest of the CMP normal.  Chol 123, TG 130, HDL 37, LDL 60. Hb1C 5.6%  03/2018: Chol 131, TG 128, HDL 36, LDL 69.   Review of Systems  Constitution: Negative for decreased appetite, malaise/fatigue, weight gain and weight loss.  HENT: Negative for congestion.   Eyes: Negative for visual disturbance.  Cardiovascular: Positive for palpitations. Negative for chest pain, dyspnea on exertion, leg swelling and syncope.  Respiratory: Negative for cough.   Endocrine: Negative for cold intolerance.   Hematologic/Lymphatic: Does not bruise/bleed easily.  Skin: Negative for itching and rash.  Musculoskeletal: Negative for myalgias.  Gastrointestinal: Negative for abdominal pain, nausea and vomiting.  Genitourinary: Negative for dysuria.  Neurological: Negative for dizziness and weakness.  Psychiatric/Behavioral: The patient is not nervous/anxious.   All other systems reviewed and are negative.        Vitals:   12/07/18 1613 12/07/18 1623  BP: (!) 177/97 (!) 165/80  Pulse: (!) 53 (!) 48  Temp: (!) 96.3 F (35.7  C) (!) 96.3 F (35.7 C)  SpO2: 98%      Body mass index is 32.1 kg/m. Filed Weights   12/07/18 1613  Weight: 236 lb 11.2 oz (107.4 kg)     Objective:   Physical Exam  Constitutional: He is oriented to person, place, and time. He appears well-developed and well-nourished. No distress.  HENT:  Head: Normocephalic and atraumatic.  Eyes: Pupils are equal, round, and reactive to light. Conjunctivae are normal.  Neck: No JVD present.  Cardiovascular: Normal rate, regular rhythm and intact distal pulses.  No murmur heard. Pulmonary/Chest: Effort normal and breath sounds normal. He has no wheezes. He has no rales.  Abdominal: Soft. Bowel sounds are normal. There is no rebound.  Musculoskeletal:        General: No edema.  Lymphadenopathy:    He has no cervical adenopathy.  Neurological: He is alert and oriented to person, place, and time. No cranial nerve deficit.  Skin: Skin is warm and dry.  Psychiatric: He has a normal mood and affect.  Nursing note and vitals reviewed.         Assessment & Recommendations:   68 year old Caucasian male with coronary artery disease status post MI and stenting in 1990s, paroxysmal Afib (new diagnosis), hyperlipidemia, hyperglycemia, obesity, former smoker.  Paroxysmal Afib: Noted on event monitor. Per patient, 3 episodes in 1 month.  Currently on atenolol 50 mg daily, and has as needed diltiazem for episodes of  palpitations. Resting HR in 40s and 50s limits further up titration.   CHA2DS2VASc score 3, annual stroke risk 3.2%- on eliquis 5 mg bid.   Sleep study is pending to evaluate for OSA. Given known CAD, I will obtain exercise nuclear stress test to evaluate for ischemia.   Finally, we discussed rate vs rhythm control therapy. He is interested in rhythm control therapy, particularly ablation. I am hopeful that we can achieve this prior to his eventual move to [facility] in 03/2019. Referred to EP.   Coronary artery disease without angina: EKG suggest old inferior infarct.  No angina symptoms at this time.  Continue aspirin, statin, atenolol.  Lipid panel well controlled.  Hypertension: Suboptimal control. He is currently out of amlodipine. Given his Afib, I will stop amlodipine and start lisinopril 10 mg daily. Check BMP on Friday.    Nigel Mormon, MD [facility] Pager: [phone] Office: [phone] If no answer Cell [phone]

## 2018-12-07 ENCOUNTER — Encounter: Payer: Self-pay | Admitting: Cardiology

## 2018-12-07 ENCOUNTER — Ambulatory Visit: Payer: Medicare Other | Admitting: Cardiology

## 2018-12-07 ENCOUNTER — Other Ambulatory Visit: Payer: Self-pay

## 2018-12-07 VITALS — BP 165/80 | HR 48 | Temp 96.3°F | Ht 72.0 in | Wt 236.7 lb

## 2018-12-07 DIAGNOSIS — I10 Essential (primary) hypertension: Secondary | ICD-10-CM

## 2018-12-07 DIAGNOSIS — I2510 Atherosclerotic heart disease of native coronary artery without angina pectoris: Secondary | ICD-10-CM | POA: Diagnosis not present

## 2018-12-07 DIAGNOSIS — I480 Paroxysmal atrial fibrillation: Secondary | ICD-10-CM | POA: Insufficient documentation

## 2018-12-07 MED ORDER — LISINOPRIL 10 MG PO TABS: 10.0000 mg | ORAL_TABLET | Freq: Every day | ORAL | 3 refills | Status: DC

## 2018-12-09 ENCOUNTER — Ambulatory Visit (INDEPENDENT_AMBULATORY_CARE_PROVIDER_SITE_OTHER)
Admission: RE | Admit: 2018-12-09 | Discharge: 2018-12-09 | Disposition: A | Discharge status: Home / Self Care | Payer: Medicare Other | Source: Ambulatory Visit | Attending: Acute Care | Admitting: Acute Care

## 2018-12-09 ENCOUNTER — Other Ambulatory Visit: Payer: Self-pay

## 2018-12-09 DIAGNOSIS — Z87891 Personal history of nicotine dependence: Secondary | ICD-10-CM

## 2018-12-09 DIAGNOSIS — Z122 Encounter for screening for malignant neoplasm of respiratory organs: Secondary | ICD-10-CM

## 2018-12-09 DIAGNOSIS — F17210 Nicotine dependence, cigarettes, uncomplicated: Secondary | ICD-10-CM

## 2018-12-11 ENCOUNTER — Institutional Professional Consult (permissible substitution): Payer: 59 | Admitting: Internal Medicine

## 2018-12-14 ENCOUNTER — Other Ambulatory Visit: Payer: Self-pay | Admitting: *Deleted

## 2018-12-14 DIAGNOSIS — Z87891 Personal history of nicotine dependence: Secondary | ICD-10-CM

## 2018-12-14 DIAGNOSIS — Z122 Encounter for screening for malignant neoplasm of respiratory organs: Secondary | ICD-10-CM

## 2018-12-23 ENCOUNTER — Other Ambulatory Visit: Payer: Medicare Other

## 2018-12-24 ENCOUNTER — Other Ambulatory Visit: Payer: Self-pay

## 2018-12-24 ENCOUNTER — Ambulatory Visit (INDEPENDENT_AMBULATORY_CARE_PROVIDER_SITE_OTHER): Payer: Medicare Other | Admitting: Neurology

## 2018-12-24 DIAGNOSIS — I4892 Unspecified atrial flutter: Secondary | ICD-10-CM

## 2018-12-24 DIAGNOSIS — G4730 Sleep apnea, unspecified: Secondary | ICD-10-CM

## 2018-12-24 DIAGNOSIS — F17200 Nicotine dependence, unspecified, uncomplicated: Secondary | ICD-10-CM

## 2018-12-24 DIAGNOSIS — I2510 Atherosclerotic heart disease of native coronary artery without angina pectoris: Secondary | ICD-10-CM

## 2018-12-24 DIAGNOSIS — R002 Palpitations: Secondary | ICD-10-CM

## 2018-12-24 DIAGNOSIS — R351 Nocturia: Secondary | ICD-10-CM

## 2018-12-24 DIAGNOSIS — G4733 Obstructive sleep apnea (adult) (pediatric): Secondary | ICD-10-CM

## 2018-12-24 DIAGNOSIS — R0683 Snoring: Secondary | ICD-10-CM

## 2018-12-24 DIAGNOSIS — E669 Obesity, unspecified: Secondary | ICD-10-CM

## 2018-12-24 DIAGNOSIS — G4720 Circadian rhythm sleep disorder, unspecified type: Secondary | ICD-10-CM

## 2018-12-28 ENCOUNTER — Ambulatory Visit (INDEPENDENT_AMBULATORY_CARE_PROVIDER_SITE_OTHER): Payer: Medicare Other

## 2018-12-28 ENCOUNTER — Ambulatory Visit: Payer: Medicare Other | Admitting: Cardiology

## 2018-12-28 ENCOUNTER — Other Ambulatory Visit: Payer: Self-pay

## 2018-12-28 DIAGNOSIS — I480 Paroxysmal atrial fibrillation: Secondary | ICD-10-CM

## 2018-12-29 NOTE — Progress Notes (Signed)
Unable to reach pt

## 2018-12-31 ENCOUNTER — Other Ambulatory Visit: Payer: Self-pay

## 2018-12-31 ENCOUNTER — Ambulatory Visit (INDEPENDENT_AMBULATORY_CARE_PROVIDER_SITE_OTHER): Payer: Medicare Other | Admitting: Cardiology

## 2018-12-31 ENCOUNTER — Encounter: Payer: Self-pay | Admitting: Cardiology

## 2018-12-31 VITALS — BP 158/80 | HR 54 | Ht 71.0 in | Wt 233.6 lb

## 2018-12-31 DIAGNOSIS — I480 Paroxysmal atrial fibrillation: Secondary | ICD-10-CM | POA: Diagnosis not present

## 2018-12-31 MED ORDER — DRONEDARONE HCL 400 MG PO TABS: 400.0000 mg | ORAL_TABLET | Freq: Two times a day (BID) | ORAL | 3 refills | Status: DC

## 2018-12-31 NOTE — Procedures (Signed)
PATIENT'S NAME:  Eric Jackson, Eric Jackson DOB:      1950/06/28      MR#:    [id number]     DATE OF RECORDING: 12/24/2018 REFERRING M.D.:  Yaakov Guthrie, MD Study Performed:   Baseline Polysomnogram HISTORY: 68 year old man with a history of hypertension, hyperlipidemia, palpitations, coronary artery disease, smoking with recent cessation, macular degeneration bilaterally, history of ventricular tachycardia, anxiety and obesity, who reports snoring and palpitations, and a recent episode of atrial flutter. The patient endorsed the Epworth Sleepiness Scale at 3 points. The patient's weight 241 pounds with a height of 72 (inches), resulting in a BMI of 32.5 kg/m2. The patient's neck circumference measured 18 inches.  CURRENT MEDICATIONS: Zyloprim, Norvasc, Eliquis, Vitamin C, ASA 81mg , Tenormin, Lipitor, Colchine, Cardizem   PROCEDURE:  This is a multichannel digital polysomnogram utilizing the Somnostar 11.2 system.  Electrodes and sensors were applied and monitored per AASM Specifications.   EEG, EOG, Chin and Limb EMG, were sampled at 200 Hz.  ECG, Snore and Nasal Pressure, Thermal Airflow, Respiratory Effort, CPAP Flow and Pressure, Oximetry was sampled at 50 Hz. Digital video and audio were recorded.      BASELINE STUDY  Lights Out was at 21:49 and Lights On at 04:42.  Total recording time (TRT) was 413.5 minutes, with a total sleep time (TST) of 324.5 minutes.   The patient's sleep latency was 74 minutes, which is delayed. REM latency was 85 minutes, which is normal. The sleep efficiency was 78.5%.       SLEEP ARCHITECTURE: WASO (Wake after sleep onset) was 29.5 minutes with mild to moderate sleep fragmentation noted. There were 41 minutes in Stage N1, 232.5 minutes Stage N2, 8 minutes Stage N3 and 43 minutes in Stage REM.  The percentage of Stage N1 was 12.6%, which is increased, Stage N2 was 71.6%, which is increased, Stage N3 was 2.5% and Stage R (REM sleep) was 13.3%, which is reduced.   RESPIRATORY  ANALYSIS:  There were a total of 15 respiratory events:  0 obstructive apneas, 0 central apneas and 0 mixed apneas with a total of 0 apneas and an apnea index (AI) of 0 /hour. There were 15 hypopneas with a hypopnea index of 2.8 /hour. The patient also had 0 respiratory event related arousals (RERAs).   The total APNEA/HYPOPNEA INDEX (AHI) was 2.8 /hour and the total RESPIRATORY DISTURBANCE INDEX was 0. 2.8 /hour.  10 events occurred in REM sleep and 10 events in NREM. The REM AHI was 14. /hour, versus a non-REM AHI of 1.1. The patient spent 137 minutes of total sleep time in the supine position and 188 minutes in non-supine.. The supine AHI was 3.5 versus a non-supine AHI of 2.2.  OXYGEN SATURATION & C02:  The Wake baseline 02 saturation was 94%, with the lowest being 76%. Time spent below 89% saturation equaled 17 minutes.   PERIODIC LIMB MOVEMENTS: The patient had a total of 0 Periodic Limb Movements.  The Periodic Limb Movement (PLM) index was 0 and the PLM Arousal index was 0/hour. The arousals were noted as: 58 were spontaneous, 0 were associated with PLMs, 1 were associated with respiratory events.  Audio and video analysis did not show any abnormal or unusual movements, behaviors, phonations or vocalizations. The patient took no bathroom breaks. Moderate snoring was noted. The EKG was in keeping with normal sinus rhythm (NSR).  Post-study, the patient indicated that sleep was worse than usual.   IMPRESSION:  1. Sleep disordered breathing 2. Dysfunctions associated with  sleep stages or arousal from sleep  RECOMMENDATIONS:  1. This study does not demonstrate any significant obstructive or central sleep disordered breathing with the exception of mild REM related OSA and a brief desaturation to 76% during supine REM sleep. Moderate snoring was noted; CPAP or autoPAP therapy is not warranted as the overall AHI was 2.8/hour. Nevertheless, weight loss and avoidance of the supine sleep position  are recommended. For disturbing snoring, an oral appliance (through a qualified dentist) can be considered.  2. This study shows sleep fragmentation and abnormal sleep stage percentages; these are nonspecific findings and per se do not signify an intrinsic sleep disorder or a cause for the patient's sleep-related symptoms. Causes include (but are not limited to) the first night effect of the sleep study, circadian rhythm disturbances, medication effect or an underlying mood disorder or medical problem.  3. The patient should be cautioned not to drive, work at heights, or operate dangerous or heavy equipment when tired or sleepy. Review and reiteration of good sleep hygiene measures should be pursued with any patient. 4. The patient will be advised to follow up with the referring provider, who will be notified of the test results.  I certify that I have reviewed the entire raw data recording prior to the issuance of this report in accordance with the Standards of Accreditation of the American Academy of Sleep Medicine (AASM)   Huston Foley, MD, PhD Diplomat, American Board of Neurology and Sleep Medicine (Neurology and Sleep Medicine)

## 2018-12-31 NOTE — Patient Instructions (Addendum)
Medication Instructions:  Your physician has recommended you make the following change in your medication:  1. START Multaq 400 mg twice a day  * If you need a refill on your cardiac medications before your next appointment, please call your pharmacy.   Labwork: None ordered  Testing/Procedures: None ordered  Follow-Up: At [facility], you and your health needs are our priority.  As part of our continuing mission to provide you with exceptional heart care, we have created designated Provider Care Teams.  These Care Teams include your primary Cardiologist (physician) and Advanced Practice Providers (APPs -  Physician Assistants and Nurse Practitioners) who all work together to provide you with the care you need, when you need it.  You will need a follow up appointment in mid January.  Please call our office 2 months in advance to schedule this appointment.  You may see one of the following Advanced Practice Providers on your designated Care Team:    Gypsy Balsam, NP  Francis Dowse, PA-C  Otilio Saber, [place]  Thank you for choosing [facility]!!   Dory Horn, RN [phone]  Any Other Special Instructions Will Be Listed Below (If Applicable).   Dronedarone tablets What is this medicine? DRONEDARONE (droe NE da rone) is an antiarrhythmic drug. It helps make your heart beat regularly. This medicine may be used for other purposes; ask your health care provider or pharmacist if you have questions. COMMON BRAND NAME(S): Multaq What should I tell my health care provider before I take this medicine? They need to know if you have any of these conditions:  heart failure  history of irregular heartbeat  liver disease  liver or lung problems with the past use of amiodarone  low levels of magnesium in the blood  low levels of potassium in the blood  other heart disease  an unusual or allergic reaction to dronedarone, other medicines, foods, dyes, or preservatives   pregnant or trying to get pregnant  breast-feeding How should I use this medicine? Take this medicine by mouth with a glass of water. Follow the directions on the prescription label. Take one tablet with the morning meal and one tablet with the evening meal. Do not take your medicine more often than directed. Do not stop taking except on the advice of your doctor or health care professional. A special MedGuide will be given to you by the pharmacist with each prescription and refill. Be sure to read this information carefully each time. Talk to your pediatrician regarding the use of this medicine in children. Special care may be needed. Overdosage: If you think you have taken too much of this medicine contact a poison control center or emergency room at once. NOTE: This medicine is only for you. Do not share this medicine with others. What if I miss a dose? If you miss a dose, take it as soon as you can. If it is almost time for your next dose, take only that dose. Do not take double or extra doses. What may interact with this medicine? Do not take this medicine with any of the following medications:  arsenic trioxide  certain antibiotics like clarithromycin, erythromycin, pentamidine, telithromycin, troleandomycin  certain medicines for depression like tricyclic antidepressants  certain medicines for fungal infections like fluconazole, itraconazole, ketoconazole, posaconazole, voriconazole  certain medicines for irregular heart beat like amiodarone, disopyramide, flecainide, ibutilide, quinidine, propafenone, sotalol  certain medicines for malaria like chloroquine, halofantrine  cisapride  cyclosporine  droperidol  haloperidol  methadone  other  medicines that prolong the QT interval (cause an abnormal heart rhythm)  pimozide  nefazodone  phenothiazines like chlorpromazine, mesoridazine, prochlorperazine, thioridazine  ritonavir  ziprasidone This medicine may also  interact with the following medications:  certain medicines for blood pressure, heart disease, or irregular heart beat like diltiazem, metoprolol, propranolol, verapamil  certain medicines for cholesterol like atorvastatin, lovastatin, simvastatin  certain medicines for seizures like carbamazepine, phenobarbital, phenytoin  digoxin  dofetilide  grapefruit juice  rifampin  sirolimus  St. John's Wort  tacrolimus This list may not describe all possible interactions. Give your health care provider a list of all the medicines, herbs, non-prescription drugs, or dietary supplements you use. Also tell them if you smoke, drink alcohol, or use illegal drugs. Some items may interact with your medicine. What should I watch for while using this medicine? Your condition will be monitored closely when you first begin therapy. Often, this drug is first started in a hospital or other monitored health care setting. Once you are on maintenance therapy, visit your doctor or health care professional for regular checks on your progress. Because your condition and use of this medicine carry some risk, it is a good idea to carry an identification card, necklace or bracelet with details of your condition, medications, and doctor or health care professional. Dennis Bast may get drowsy or dizzy. Do not drive, use machinery, or do anything that needs mental alertness until you know how this medicine affects you. Do not stand or sit up quickly, especially if you are an older patient. This reduces the risk of dizzy or fainting spells. What side effects may I notice from receiving this medicine? Side effects that you should report to your doctor or health care professional as soon as possible:  allergic reactions like skin rash, itching or hives, swelling of the face, lips, or tongue  breathing problems  cough  dark urine  fast, irregular heartbeat  general ill feeling or flu-like symptoms  light-colored stools   loss of appetite, nausea  right upper belly pain  slow heartbeat  stomach pain  swelling of the legs or ankles  unusually weak or tired  weight gain  yellowing of the eyes or skin Side effects that usually do not require medical attention (report to your doctor or health care professional if they continue or are bothersome):  nausea  vomiting  stomach pain This list may not describe all possible side effects. Call your doctor for medical advice about side effects. You may report side effects to FDA at 1-800-FDA-1088. Where should I keep my medicine? Keep out of the reach of children. Store at room temperature between 15 and 30 degrees C (59 and 86 degrees F). Throw away any unused medicine after the expiration date. NOTE: This sheet is a summary. It may not cover all possible information. If you have questions about this medicine, talk to your doctor, pharmacist, or health care provider.  2020 Elsevier/Gold Standard (2018-01-26 10:43:10)

## 2018-12-31 NOTE — Progress Notes (Signed)
Patient referred by Dr. Virgina Jock, seen by me on 11/25/18, diagnostic PSG on 12/24/18.   Please call and notify the patient that the recent sleep study did not demonstrate any significant obstructive or central sleep disordered breathing with the exception of mild REM related OSA and a brief desaturation to 76% during supine REM sleep. Moderate snoring was noted; CPAP or autoPAP therapy is not warranted as the overall AHI was 2.8/hour. Nevertheless, weight loss and avoidance of the supine sleep position are recommended. For disturbing snoring, an oral appliance (through a qualified dentist) can be considered. At this juncture, he can FU with PCP and cardiologist.  Thanks,  Star Age, MD, PhD [facility] ([place])

## 2018-12-31 NOTE — Progress Notes (Signed)
Electrophysiology Office Note   Date:  12/31/2018   ID:  Sherril CongRyan W Jackson, DOB 01-14-1951, MRN [id number]  PCP:  Ileana LaddWong, Francis P, MD  Cardiologist:  Rosemary HolmsPatwardhan Primary Electrophysiologist:  Will Jorja LoaMartin Camnitz, MD    Chief Complaint: AF   History of Present Illness: Eric Jackson is a 68 y.o. male who is being seen today for the evaluation of AF at the request of Patwardhan, Anabel BeneManish J, MD. Presenting today for electrophysiology evaluation.  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  Atrial fibrillation was diagnosed on an event monitor.  He had been having palpitations for the last 3 months.  Patient is a lasted up to an hour at a time.  Since his initial episode of atrial fibrillation, he has continued to have palpitations but they have not been quite as bad.  No exacerbating or alleviating factors.  He is planning on [place] to [place] to be with his girlfriend potentially at the end of January at the earliest.  Today, he denies symptoms of palpitations, chest pain, shortness of breath, orthopnea, PND, lower extremity edema, claudication, dizziness, presyncope, syncope, bleeding, or neurologic sequela. The patient is tolerating medications without difficulties.    Past Medical History:  Diagnosis Date  . Anxiety   . Coronary artery disease   . History of atherosclerotic cardiovascular disease   . Hyperlipemia   . Hypertension   . Ischemic heart disease   . Situational stress   . Tobacco abuse   . Ventricular tachycardia [facility](HCC)    Past Surgical History:  Procedure Laterality Date  . artery  1995   artery collasped 1 sstent placed   . CARDIAC CATHETERIZATION  1996   normal EF -- normal LV function -- minimal coronary irregularities with no significant focal disease   . CARDIAC CATHETERIZATION  2004   Est. EF of 55-60%placement of x2 stents to the  right coronary artery     Current Outpatient Medications  Medication Sig Dispense Refill  . allopurinol (ZYLOPRIM) 100 MG tablet Take 200 mg by mouth daily.     Marland Kitchen. apixaban (ELIQUIS) 5 MG TABS tablet Take 1 tablet (5 mg total) by mouth 2 (two) times daily. 60 tablet 2  . Ascorbic Acid (VITAMIN C PO) Take 1 tablet by mouth daily.      Marland Kitchen. aspirin 81 MG tablet Take 81 mg by mouth daily.      Marland Kitchen. atenolol (TENORMIN) 50 MG tablet Take 50 mg by mouth daily.    Marland Kitchen. atorvastatin (LIPITOR) 80 MG tablet Take 80 mg by mouth daily.    . colchicine 0.6 MG tablet Take 0.6 mg by mouth 2 (two) times daily as needed.    . diltiazem (CARDIZEM) 30 MG tablet Take 1 tablet (30 mg total) by mouth every 8 (eight) hours as needed. 30 tablet 2  . lisinopril (ZESTRIL) 10 MG tablet Take 1 tablet (10 mg total) by mouth daily. 90 tablet 3   No current facility-administered medications for this visit.     Allergies:   Patient has no known allergies.   Social History:  The patient  reports that he quit smoking about 8 months ago. His smoking use included cigarettes. He has a 38.00 pack-year smoking history. He has never used smokeless tobacco. He reports current alcohol use. He reports that he does not use drugs.   Family History:  The patient's family history includes Coronary  artery disease in his mother; Heart attack in his father; Heart disease in his brother; Heart failure in his sister; Kidney disease in his brother.    ROS:  Please see the history of present illness.   Otherwise, review of systems is positive for none.   All other systems are reviewed and negative.    PHYSICAL EXAM: VS:  BP (!) 158/80   Pulse (!) 54   Ht 5\' 11"  (1.803 m)   Wt 233 lb 9.6 oz (106 kg)   SpO2 98%   BMI 32.58 kg/m  , BMI Body mass index is 32.58 kg/m. GEN: Well nourished, well developed, in no acute distress  HEENT: normal  Neck: no JVD, carotid bruits, or masses Cardiac: RRR; no murmurs, rubs, or gallops,no edema   Respiratory:  clear to auscultation bilaterally, normal work of breathing GI: soft, nontender, nondistended, + BS MS: no deformity or atrophy  Skin: warm and dry Neuro:  Strength and sensation are intact Psych: euthymic mood, full affect  EKG:  EKG is not ordered today. Personal review of the ekg ordered 12/07/2018 shows sinus rhythm, rate 48  Recent Labs: No results found for requested labs within last 8760 hours.    Lipid Panel  No results found for: CHOL, TRIG, HDL, CHOLHDL, VLDL, LDLCALC, LDLDIRECT   Wt Readings from Last 3 Encounters:  12/31/18 233 lb 9.6 oz (106 kg)  12/07/18 236 lb 11.2 oz (107.4 kg)  11/25/18 241 lb (109.3 kg)      Other studies Reviewed: Additional studies/ records that were reviewed today include: TTE 11/25/18  Review of the above records today demonstrates:  Left ventricle cavity is normal in size. Moderate concentric hypertrophy of the left ventricle. Normal LV systolic function with visual EF 55-60%. Normal global wall motion. Doppler evidence of grade I (impaired) diastolic dysfunction, normal LAP. Calculated EF 46%. Left atrial cavity is mildly dilated. Mild tricuspid regurgitation.  No evidence of pulmonary hypertension.  SPECT 12/28/18 Normal ECG stress. Patient exercised for a total of 6 minutes and 15 seconds, achieving approximately 7.41 METs. Resting EKG/ECG demonstrated normal sinus rhythm. Non-specific ST-T abnormality. Peak EKG/ECG demonstrated normal sinus rhythm. Non-specific ST-T abnormality. During exercise the peak ECG revealed brief multifocal atrial tachycardia. Mild diaphragmatic attenuation noted. Normal myocardial perfusion without ischemia or scar.  Stress LV EF is mildly dysfunctional 42% although, visually appeared to be normal.  No previous exam available for comparison. Intermediate risk study.   ASSESSMENT AND PLAN:  1.  Paroxysmal atrial fibrillation: Currently on Eliquis, atenolol, and as needed diltiazem.  Sleep study  is pending.  He has been having minimal atrial fibrillation since his initial episode, though he was quite symptomatic.  Due to that, we will plan to start him on Multaq.  We will see him back in the middle of January.  He is planning to move to [place] and would prefer to hold off on any procedures until he moves.  This patients CHA2DS2-VASc Score and unadjusted Ischemic Stroke Rate (% per year) is equal to 3.2 % stroke rate/year from a score of 3  Above score calculated as 1 point each if present [CHF, HTN, DM, Vascular=MI/PAD/Aortic Plaque, Age if 65-74, or Male] Above score calculated as 2 points each if present [Age > 75, or Stroke/TIA/TE]  2.  Hypertension: Elevated today but is usually better controlled according to the patient.  No changes.  3.  Coronary artery disease: ECG suggest inferior infarct.  Continue aspirin, statin, atenolol per primary cardiology.  Current  medicines are reviewed at length with the patient today.   The patient does not have concerns regarding his medicines.  The following changes were made today: Start Multaq  Labs/ tests ordered today include:  No orders of the defined types were placed in this encounter.  Discussed with referring cardiologist  Disposition:   FU with Will Camnitz 3 months  Signed, Will Meredith Leeds, MD  12/31/2018 11:46 AM     [facility] [address] [place] [address] [phone] (office) [phone] (fax)

## 2019-01-04 ENCOUNTER — Telehealth: Payer: Self-pay

## 2019-01-04 NOTE — Telephone Encounter (Signed)
I called pt and discussed his sleep study results and recommendations. Pt will follow up with his PCP and cardiologist. Pt verbalized understanding of results. Pt had no questions at this time but was encouraged to call back if questions arise.

## 2019-01-04 NOTE — Telephone Encounter (Signed)
-----   Message from Star Age, MD sent at 12/31/2018  6:15 PM EST ----- Patient referred by Dr. Virgina Jock, seen by me on 11/25/18, diagnostic PSG on 12/24/18.   Please call and notify the patient that the recent sleep study did not demonstrate any significant obstructive or central sleep disordered breathing with the exception of mild REM related OSA and a brief desaturation to 76% during supine REM sleep. Moderate snoring was noted; CPAP or autoPAP therapy is not warranted as the overall AHI was 2.8/hour. Nevertheless, weight loss and avoidance of the supine sleep position are recommended. For disturbing snoring, an oral appliance (through a qualified dentist) can be considered. At this juncture, he can FU with PCP and cardiologist.  Thanks,  Star Age, MD, PhD [facility] ([place])

## 2019-02-20 ENCOUNTER — Other Ambulatory Visit: Payer: Self-pay | Admitting: Cardiology

## 2019-02-20 DIAGNOSIS — I4892 Unspecified atrial flutter: Secondary | ICD-10-CM

## 2019-03-08 ENCOUNTER — Ambulatory Visit: Payer: Medicare Other | Admitting: Cardiology

## 2019-03-16 ENCOUNTER — Ambulatory Visit: Payer: Medicare Other | Admitting: Cardiology

## 2019-03-22 ENCOUNTER — Other Ambulatory Visit: Payer: Self-pay

## 2019-03-22 ENCOUNTER — Encounter: Payer: Self-pay | Admitting: Student

## 2019-03-22 ENCOUNTER — Ambulatory Visit (INDEPENDENT_AMBULATORY_CARE_PROVIDER_SITE_OTHER): Payer: Medicare Other | Admitting: Cardiology

## 2019-03-22 ENCOUNTER — Ambulatory Visit: Payer: Medicare Other | Admitting: Student

## 2019-03-22 ENCOUNTER — Encounter: Payer: Self-pay | Admitting: Cardiology

## 2019-03-22 VITALS — BP 150/74 | HR 45 | Ht 72.0 in | Wt 237.0 lb

## 2019-03-22 VITALS — BP 166/75 | HR 52 | Temp 97.3°F | Resp 16 | Ht 72.0 in | Wt 236.9 lb

## 2019-03-22 DIAGNOSIS — I2510 Atherosclerotic heart disease of native coronary artery without angina pectoris: Secondary | ICD-10-CM | POA: Diagnosis not present

## 2019-03-22 DIAGNOSIS — I480 Paroxysmal atrial fibrillation: Secondary | ICD-10-CM

## 2019-03-22 DIAGNOSIS — I10 Essential (primary) hypertension: Secondary | ICD-10-CM | POA: Diagnosis not present

## 2019-03-22 MED ORDER — LISINOPRIL 40 MG PO TABS: 40.0000 mg | ORAL_TABLET | Freq: Every day | ORAL | 3 refills | Status: DC

## 2019-03-22 NOTE — Progress Notes (Signed)
PCP:  Ileana Ladd, MD Primary Cardiologist: Dr. Rosemary Holms Electrophysiologist: Dr. Domingo Sep Eric Jackson is a 69 y.o. male with past medical history of AF who presents today for routine electrophysiology followup. They are seen for Dr. Elberta Fortis.   Started on multaq at last visit. Since last being seen in our clinic, the patient reports doing very well. Very few palpitations. Doing all of his ADLs without difficulty. He just got back from signing a lease in [place], [place]. Plans to move there within the next few months once he settles with his job and current apartment. Denies bleeding on Eliquis. No weight gain or peripheral edema.   The patient feels that he is tolerating medications without difficulties and is otherwise without complaint today.   Past Medical History:  Diagnosis Date  . Anxiety   . Coronary artery disease   . History of atherosclerotic cardiovascular disease   . Hyperlipemia   . Hypertension   . Ischemic heart disease   . Situational stress   . Tobacco abuse   . Ventricular tachycardia [facility])    Past Surgical History:  Procedure Laterality Date  . artery  1995   artery collasped 1 sstent placed   . CARDIAC CATHETERIZATION  1996   normal EF -- normal LV function -- minimal coronary irregularities with no significant focal disease   . CARDIAC CATHETERIZATION  2004   Est. EF of 55-60%placement of x2 stents to the right coronary artery    Current Outpatient Medications  Medication Sig Dispense Refill  . allopurinol (ZYLOPRIM) 100 MG tablet Take 200 mg by mouth daily.     . Ascorbic Acid (VITAMIN C PO) Take 1 tablet by mouth daily.      Marland Kitchen aspirin 81 MG tablet Take 81 mg by mouth daily.      Marland Kitchen atenolol (TENORMIN) 50 MG tablet Take 50 mg by mouth daily.    Marland Kitchen atorvastatin (LIPITOR) 80 MG tablet Take 80 mg by mouth daily.    . colchicine 0.6 MG tablet Take 0.6 mg by mouth 2 (two) times daily as needed.    . diltiazem (CARDIZEM) 30 MG tablet Take 1 tablet (30 mg  total) by mouth every 8 (eight) hours as needed. 30 tablet 2  . dronedarone (MULTAQ) 400 MG tablet Take 1 tablet (400 mg total) by mouth 2 (two) times daily with a meal. 60 tablet 3  . ELIQUIS 5 MG TABS tablet TAKE 1 TABLET BY MOUTH TWICE A DAY 60 tablet 2  . lisinopril (ZESTRIL) 10 MG tablet Take 1 tablet (10 mg total) by mouth daily. 90 tablet 3  . losartan (COZAAR) 25 MG tablet Take 25 mg by mouth daily.     No current facility-administered medications for this visit.    No Known Allergies  Social History   Socioeconomic History  . Marital status: Divorced    Spouse name: Not on file  . Number of children: 2  . Years of education: Not on file  . Highest education level: Not on file  Occupational History  . Not on file  Tobacco Use  . Smoking status: Former Smoker    Packs/day: 1.00    Years: 38.00    Pack years: 38.00    Types: Cigarettes    Quit date: 04/21/2018    Years since quitting: 0.9  . Smokeless tobacco: Never Used  Substance and Sexual Activity  . Alcohol use: Yes    Comment: occ  . Drug use: Never  .  Sexual activity: Not on file  Other Topics Concern  . Not on file  Social History Narrative  . Not on file   Social Determinants of Health   Financial Resource Strain:   . Difficulty of Paying Living Expenses: Not on file  Food Insecurity:   . Worried About Charity fundraiser in the Last Year: Not on file  . Ran Out of Food in the Last Year: Not on file  Transportation Needs:   . Lack of Transportation (Medical): Not on file  . Lack of Transportation (Non-Medical): Not on file  Physical Activity:   . Days of Exercise per Week: Not on file  . Minutes of Exercise per Session: Not on file  Stress:   . Feeling of Stress : Not on file  Social Connections:   . Frequency of Communication with Friends and Family: Not on file  . Frequency of Social Gatherings with Friends and Family: Not on file  . Attends Religious Services: Not on file  . Active Member of  Clubs or Organizations: Not on file  . Attends Archivist Meetings: Not on file  . Marital Status: Not on file  Intimate Partner Violence:   . Fear of Current or Ex-Partner: Not on file  . Emotionally Abused: Not on file  . Physically Abused: Not on file  . Sexually Abused: Not on file     Review of Systems: General: No chills, fever, night sweats or weight changes  Cardiovascular:  No chest pain, dyspnea on exertion, edema, orthopnea, palpitations, paroxysmal nocturnal dyspnea Dermatological: No rash, lesions or masses Respiratory: No cough, dyspnea Urologic: No hematuria, dysuria Abdominal: No nausea, vomiting, diarrhea, bright red blood per rectum, melena, or hematemesis Neurologic: No visual changes, weakness, changes in mental status All other systems reviewed and are otherwise negative except as noted above.  Physical Exam: Vitals:   03/22/19 1301  BP: (!) 150/74  Pulse: (!) 45  SpO2: 96%  Weight: 237 lb (107.5 kg)  Height: 6' (1.829 m)    GEN- The patient is well appearing, alert and oriented x 3 today.   HEENT: normocephalic, atraumatic; sclera clear, conjunctiva pink; hearing intact; oropharynx clear; neck supple, no JVP Lymph- no cervical lymphadenopathy Lungs- Clear to ausculation bilaterally, normal work of breathing.  No wheezes, rales, rhonchi Heart- Regular rate and rhythm, no murmurs, rubs or gallops, PMI not laterally displaced GI- soft, non-tender, non-distended, bowel sounds present, no hepatosplenomegaly Extremities- no clubbing, cyanosis, or edema; DP/PT/radial pulses 2+ bilaterally MS- no significant deformity or atrophy Skin- warm and dry, no rash or lesion Psych- euthymic mood, full affect Neuro- strength and sensation are intact  EKG is ordered. Personal review of EKG from today shows Sinus brady at 45 bpm,  Assessment and Plan:  1.  Paroxysmal atrial fibrillation:  Continue atenolol and prn diltiazem Continue Eliquis for CHA2DS2VASC  of at least 3.   Continue Multaq.  He would like to hold off on any procedures until he is moved to [place] and established there.  No significant OSA on sleep study.   2. HTN Continue current medications.   3. CAD Continue ASA, statin, and atenolol per primary cardiology.   4. Chronic bradycardia Asymptomatic. No change for now.   Follow up as needed. I asked him to let us know if he has not moved in 6 months, so we can see him again in a reasonable amount of time.  I instructed him to see a PCP within weeks of moving if  possible, and to be referred to EP/Cards from there.   Graciella Freer, PA-C  03/22/19 1:57 PM

## 2019-03-22 NOTE — Patient Instructions (Signed)
Medication Instructions:  none *If you need a refill on your cardiac medications before your next appointment, please call your pharmacy*  Lab Work: none If you have labs (blood work) drawn today and your tests are completely normal, you will receive your results only by: Marland Kitchen MyChart Message (if you have MyChart) OR . A paper copy in the mail If you have any lab test that is abnormal or we need to change your treatment, we will call you to review the results.  Testing/Procedures: none  Follow-Up: At [facility], you and your health needs are our priority.  As part of our continuing mission to provide you with exceptional heart care, we have created designated Provider Care Teams.  These Care Teams include your primary Cardiologist (physician) and Advanced Practice Providers (APPs -  Physician Assistants and Nurse Practitioners) who all work together to provide you with the care you need, when you need it.  Your next appointment:  As Needed  The format for your next appointment:    Provider:    Other Instructions

## 2019-03-22 NOTE — Progress Notes (Signed)
Patient referred by Vernie Shanks, MD for abnormal EKG, palpitations  Subjective:   Eric Jackson, male    DOB: 06/29/1950, 69 y.o.   MRN: [id number]   Chief Complaint  Patient presents with  . Coronary Artery Disease    3 month follow up    HPI  69 year old Caucasian male with coronary artery disease status post MI and stenting in 1990s, paroxysmal Afib (new diagnosis), hyperlipidemia, hyperglycemia, obesity, former smoker.  Patient is seeing EP. They have recommended Multaq. His symptoms have improved. He has voiced intentions to consider ablation in the future, probably after moving to [place].    Current Outpatient Medications on File Prior to Visit  Medication Sig Dispense Refill  . allopurinol (ZYLOPRIM) 100 MG tablet Take 200 mg by mouth daily.     . Ascorbic Acid (VITAMIN C PO) Take 1 tablet by mouth daily.      Marland Kitchen aspirin 81 MG tablet Take 81 mg by mouth daily.      Marland Kitchen atenolol (TENORMIN) 50 MG tablet Take 50 mg by mouth daily.    Marland Kitchen atorvastatin (LIPITOR) 80 MG tablet Take 80 mg by mouth daily.    . colchicine 0.6 MG tablet Take 0.6 mg by mouth 2 (two) times daily as needed.    . diltiazem (CARDIZEM) 30 MG tablet Take 1 tablet (30 mg total) by mouth every 8 (eight) hours as needed. 30 tablet 2  . dronedarone (MULTAQ) 400 MG tablet Take 1 tablet (400 mg total) by mouth 2 (two) times daily with a meal. 60 tablet 3  . ELIQUIS 5 MG TABS tablet TAKE 1 TABLET BY MOUTH TWICE A DAY 60 tablet 2  . lisinopril (ZESTRIL) 10 MG tablet Take 1 tablet (10 mg total) by mouth daily. 90 tablet 3   No current facility-administered medications on file prior to visit.    Cardiovascular studies:  Exercise tetrofosmin stress test  12/28/2018: Normal ECG stress. Patient exercised for a total of 6 minutes and 15 seconds, achieving approximately 7.41 METs. Resting EKG/ECG demonstrated normal sinus rhythm. Non-specific ST-T abnormality. Peak EKG/ECG demonstrated normal sinus rhythm.  Non-specific ST-T abnormality. During exercise the peak ECG revealed brief multifocal atrial tachycardia. Mild diaphragmatic attenuation noted. Normal myocardial perfusion without ischemia or scar.  Stress LV EF is mildly dysfunctional 42% although, visually appeared to be normal.  No previous exam available for comparison. Intermediate risk study.   EKG 12/07/2018: Sinus bradycardia 48 bpm. Nonspecific T-abnormality.  lead loss lead V3.  Echocardiogram 12/03/2018: Left ventricle cavity is normal in size. Moderate concentric hypertrophy of the left ventricle. Normal LV systolic function with visual EF 55-60%. Normal global wall motion. Doppler evidence of grade I (impaired) diastolic dysfunction, normal LAP.  Left atrial cavity is mildly dilated. Mild tricuspid regurgitation.  No evidence of pulmonary hypertension.  Abdominal Aortic Duplex  12/03/2018: No evidence of aneurysm. The maximum aorta (sac) diameter is 1.92 cm (dist). Diffuse calcific plaque observed in the proximal, mid and distal aorta. Normal flow velocities noted in the aorta and internal iliac arteries.  EKG 11/20/2018: Sinus rhythm 51 bpm. Old inferior infarct Lateral T wave inversion, consider ischemia.   CT Chest lung cancer screening 11/2017: CT 1. Lung-RADS 2, benign appearance or behavior. Continue annual screening with low-dose chest CT without contrast in 12 months. 2. Aortic atherosclerosis (ICD10-170.0). Three-vessel coronary artery calcification. 3.  Emphysema (ICD10-J43.9).    Recent labs: 07/24/2018: Glucose 102. BUN/Cr 13/1.08. eGFR 68. Na/K 138/5.0.Rest of the CMP normal.  Chol  123, TG 130, HDL 37, LDL 60. Hb1C 5.6%  03/2018: Chol 131, TG 128, HDL 36, LDL 69.   Review of Systems  Cardiovascular: Positive for palpitations. Negative for chest pain, dyspnea on exertion, leg swelling and syncope.         Vitals:   03/22/19 1454  BP: (!) 166/75  Pulse: (!) 52  Resp: 16  Temp: (!) 97.3 F  (36.3 C)     Body mass index is 32.13 kg/m. Filed Weights   03/22/19 1454  Weight: 236 lb 14.4 oz (107.5 kg)     Objective:   Physical Exam  Constitutional: He appears well-developed and well-nourished.  Neck: No JVD present.  Cardiovascular: Normal rate, regular rhythm, normal heart sounds and intact distal pulses.  No murmur heard. Pulmonary/Chest: Effort normal and breath sounds normal. He has no wheezes. He has no rales.  Musculoskeletal:        General: No edema.  Nursing note and vitals reviewed.         Assessment & Recommendations:   69 year old Caucasian male with coronary artery disease status post MI and stenting in 1990s, paroxysmal Afib (new diagnosis), hyperlipidemia, hyperglycemia, obesity, former smoker.  Paroxysmal Afib: No ischemia on stress test, Emphasiezd importance of managing OSA. CHA2DS2VASc score 3, annual stroke risk 3.2%- on eliquis 5 mg bid.  Tolerating dronedarone with improvement in symptoms.  Coronary artery disease without angina: EKG suggest old inferior infarct.  No angina symptoms at this time.   Stopped Aspirin, given ongoing use of eliquis and stable CAD. Continue statin, atenolol.  Lipid panel well controlled.  Hypertension: Suboptimal control. He was inadvertently on lisinopril 10 and losartan 25. Stopped Losartan and increased lisinopril to 40 mg daily. Repeat BMP in 1 week.  F/u in 6 months, if he has not moved to [place] by then.  Nigel Mormon, MD [facility] Pager: [phone] Office: [phone] If no answer Cell [phone]

## 2019-03-22 NOTE — Addendum Note (Signed)
Addended by: Elder Negus on: 03/22/2019 04:23 PM   Modules accepted: Orders

## 2019-03-26 ENCOUNTER — Other Ambulatory Visit: Payer: Self-pay | Admitting: Cardiology

## 2019-04-10 ENCOUNTER — Ambulatory Visit: Payer: Medicare Other | Attending: Internal Medicine

## 2019-04-10 DIAGNOSIS — Z23 Encounter for immunization: Secondary | ICD-10-CM

## 2019-04-10 NOTE — Progress Notes (Signed)
   Covid-19 Vaccination Clinic  Name:  TARVIS BLOSSOM    MRN: [id number] DOB: 1950-07-26  04/10/2019  Mr. Stencel was observed post Covid-19 immunization for 15 minutes without incidence. He was provided with Vaccine Information Sheet and instruction to access the V-Safe system.   Mr. Deremer was instructed to call 911 with any severe reactions post vaccine: Marland Kitchen Difficulty breathing  . Swelling of your face and throat  . A fast heartbeat  . A bad rash all over your body  . Dizziness and weakness    Immunizations Administered    Name Date Dose VIS Date Route   Pfizer COVID-19 Vaccine 04/10/2019 11:30 AM 0.3 mL 01/29/2019 Intramuscular   Manufacturer: [employer], [employer]   Lot: [id number]   NDC: [address]-2

## 2019-05-04 ENCOUNTER — Ambulatory Visit: Payer: Medicare Other | Attending: Internal Medicine

## 2019-05-04 DIAGNOSIS — Z23 Encounter for immunization: Secondary | ICD-10-CM

## 2019-05-04 NOTE — Progress Notes (Signed)
   Covid-19 Vaccination Clinic  Name:  Eric Jackson    MRN: [id number] DOB: [birth year]  05/04/2019  Mr. Eric Jackson was observed post Covid-19 immunization for 15 minutes without incident. He was provided with Vaccine Information Sheet and instruction to access the V-Safe system.   Mr. Eric Jackson was instructed to call 911 with any severe reactions post vaccine: Marland Kitchen Difficulty breathing  . Swelling of face and throat  . A fast heartbeat  . A bad rash all over body  . Dizziness and weakness   Immunizations Administered    Name Date Dose VIS Date Route   Pfizer COVID-19 Vaccine 05/04/2019 11:30 AM 0.3 mL 01/29/2019 Intramuscular   Manufacturer: [employer], [employer]   Lot: [id number]   NDC: [address]-8

## 2019-05-24 ENCOUNTER — Other Ambulatory Visit: Payer: Self-pay | Admitting: Cardiology

## 2019-05-24 DIAGNOSIS — I4892 Unspecified atrial flutter: Secondary | ICD-10-CM

## 2019-07-20 ENCOUNTER — Ambulatory Visit: Payer: Medicare Other

## 2019-09-20 ENCOUNTER — Ambulatory Visit: Payer: Medicare Other | Admitting: Cardiology

## 2021-06-15 IMAGING — CT CT CHEST LUNG CANCER SCREENING LOW DOSE W/O CM
2 of 3 series · 15 of 36 positions shown, 18 images · non-contrast
Comparison: 11/21/2017 screening chest CT.

CLINICAL DATA: 68-year-old asymptomatic male former smoker with 38
pack-year smoking history, quit smoking April 21, 2018.

EXAM:
CT CHEST WITHOUT CONTRAST LOW-DOSE FOR LUNG CANCER SCREENING
TECHNIQUE: Multidetector CT imaging of the chest was performed following the
standard protocol without IV contrast.

[Series 2: thorax 5.0 i31f 3 · axial · 0.88mm/px · z∈[-367,-82]mm · 12 of 67 slices shown, 15 images]
[im 5/67  mediastinal]
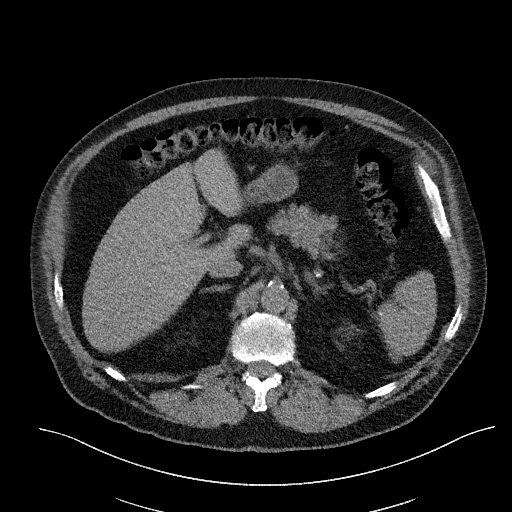
[im 5/67  lung]
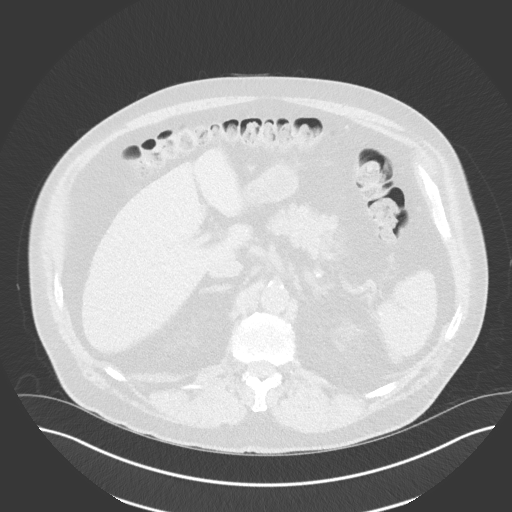
[im 10/67  lung]
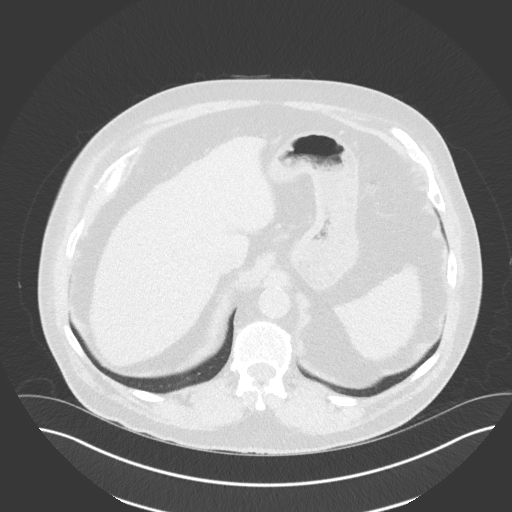
[im 15/67  lung]
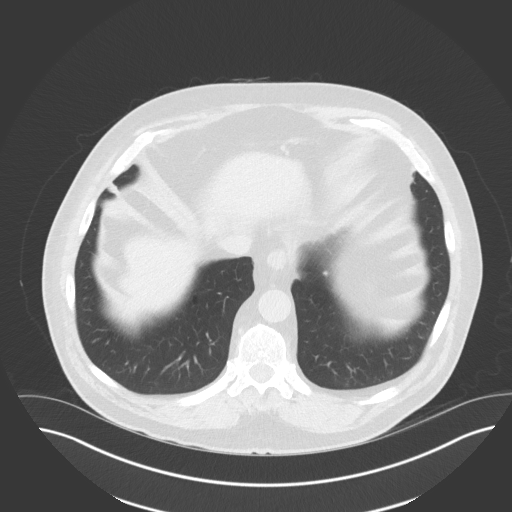
[im 20/67  lung]
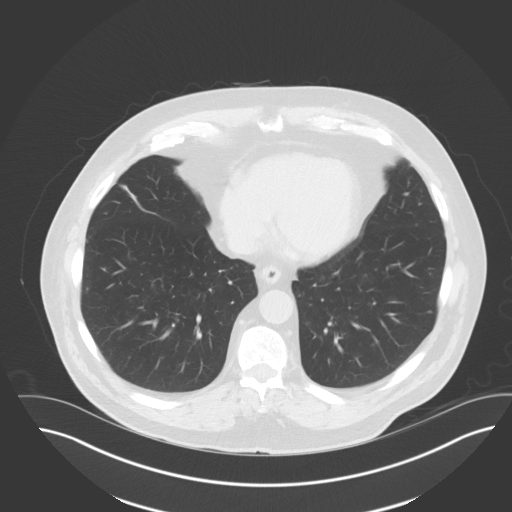
[im 25/67  mediastinal]
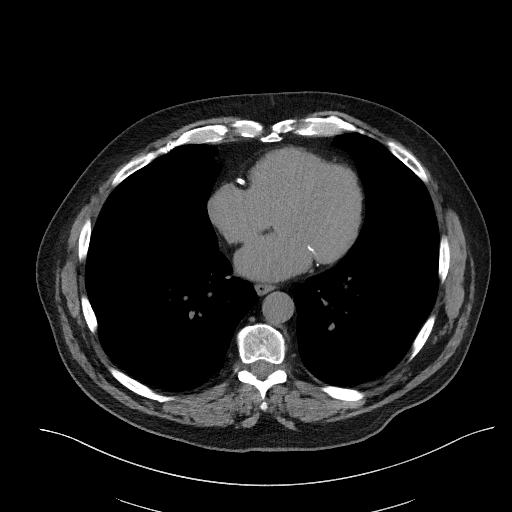
[im 25/67  lung]
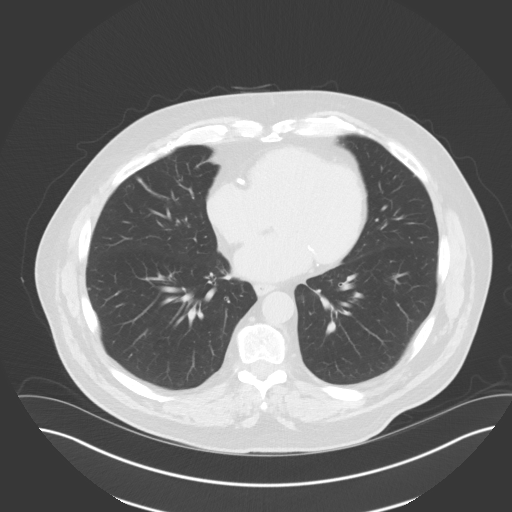
[im 30/67  lung]
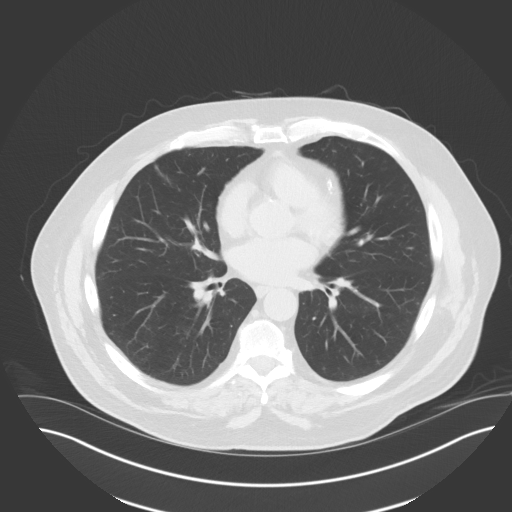
[im 37/67  lung]
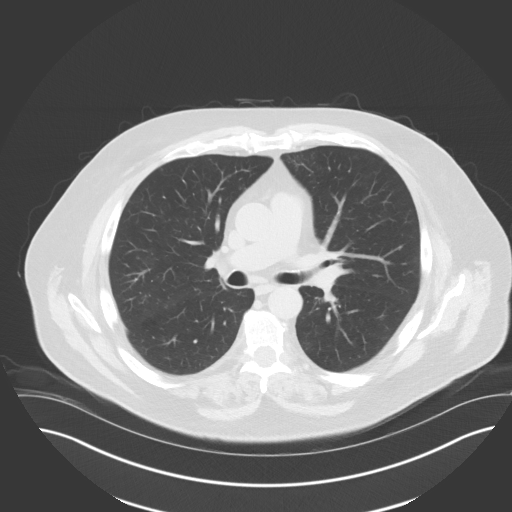
[im 42/67  lung]
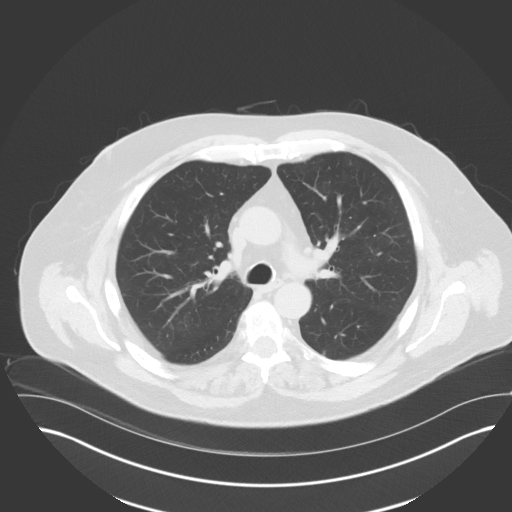
[im 47/67  mediastinal]
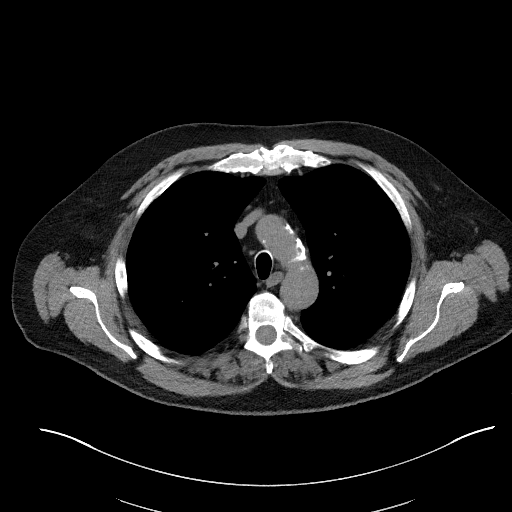
[im 47/67  lung]
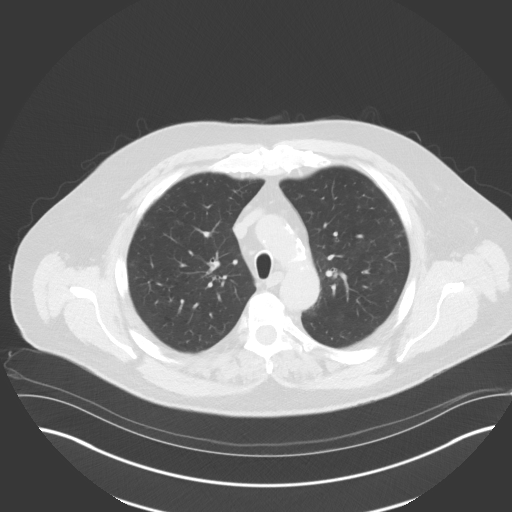
[im 52/67  lung]
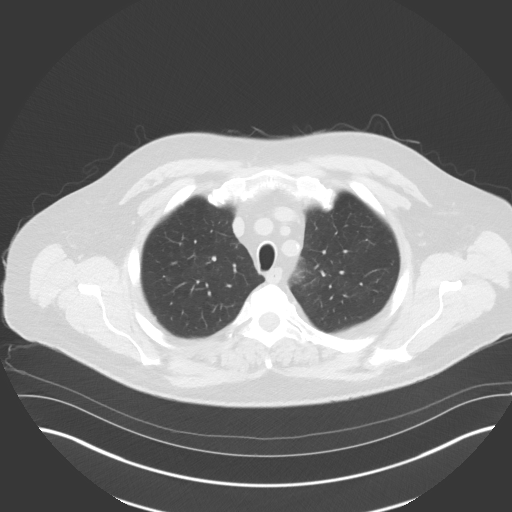
[im 57/67  lung]
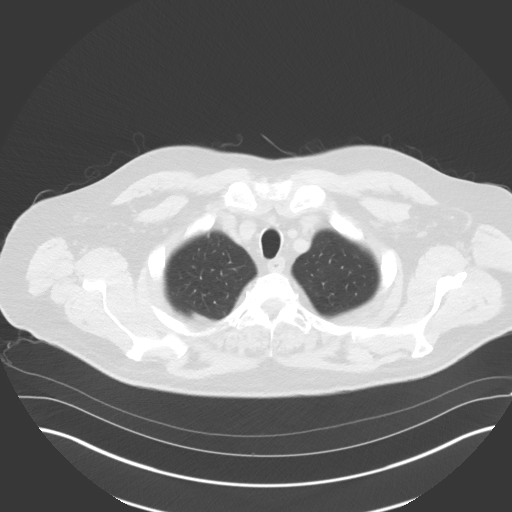
[im 62/67  lung]
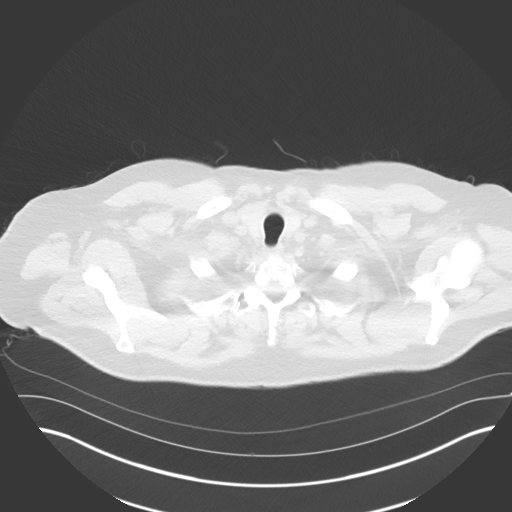

[Series 5: coronal · coronal · 0.65mm/px · 3 of 136 slices shown]
[im 28/136  lung]
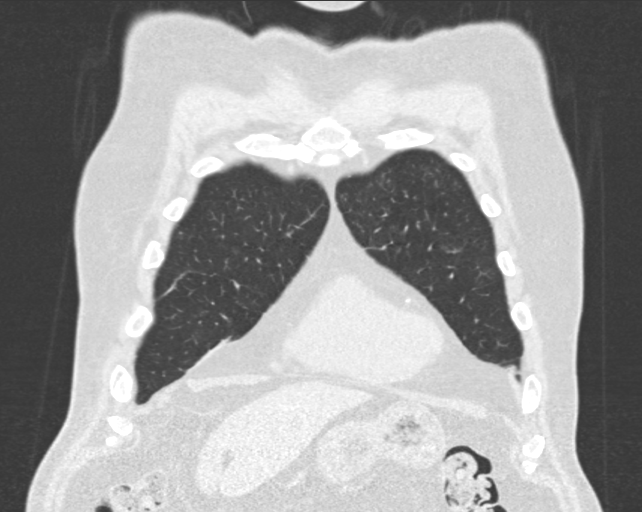
[im 55/136  lung]
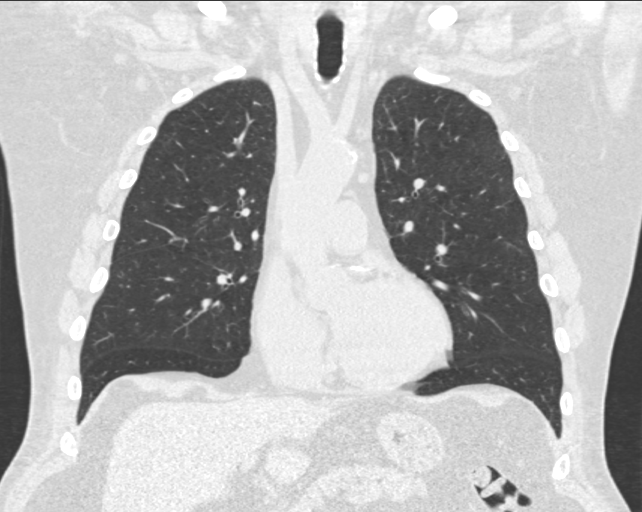
[im 82/136  lung]
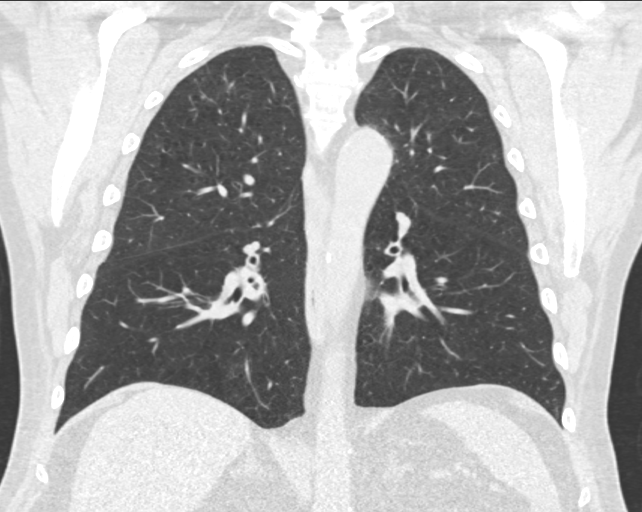

[15 of 36 positions shown; findings below may reference images not displayed]

FINDINGS: Cardiovascular: Normal heart size. No significant pericardial
effusion/thickening. Three-vessel coronary atherosclerosis.
Atherosclerotic nonaneurysmal thoracic aorta. Normal caliber
pulmonary arteries.

Mediastinum/Nodes: No discrete thyroid nodules. Unremarkable
esophagus. No pathologically enlarged axillary, mediastinal or hilar
lymph nodes, noting limited sensitivity for the detection of hilar
adenopathy on this noncontrast study.

Lungs/Pleura: No pneumothorax. No pleural effusion. Moderate
centrilobular emphysema. No acute consolidative airspace disease or
lung masses. No significant growth of previously visualized
scattered small pulmonary nodules. Several new scattered small
pulmonary nodules in both lungs, largest 3.7 mm in volume derived
mean diameter in the medial basilar left lower lobe (series 3/image
260).

Upper abdomen: No acute abnormality.

Musculoskeletal: No aggressive appearing focal osseous lesions. Mild
thoracic spondylosis.
IMPRESSION: 1. Lung-RADS 2, benign appearance or behavior. Continue annual
screening with low-dose chest CT without contrast in 12 months.
2. Three-vessel coronary atherosclerosis.

Aortic Atherosclerosis (NJBS4-FFL.L) and Emphysema (NJBS4-NTE.7).

## 2023-12-02 ENCOUNTER — Emergency Department (HOSPITAL_BASED_OUTPATIENT_CLINIC_OR_DEPARTMENT_OTHER)

## 2023-12-02 ENCOUNTER — Other Ambulatory Visit: Payer: Self-pay

## 2023-12-02 ENCOUNTER — Inpatient Hospital Stay (HOSPITAL_BASED_OUTPATIENT_CLINIC_OR_DEPARTMENT_OTHER)
Admission: EM | Admit: 2023-12-02 | Discharge: 2023-12-05 | DRG: 065 | Disposition: A | Discharge status: Home / Self Care | Attending: Internal Medicine | Admitting: Internal Medicine

## 2023-12-02 DIAGNOSIS — T447X6A Underdosing of beta-adrenoreceptor antagonists, initial encounter: Secondary | ICD-10-CM | POA: Diagnosis present

## 2023-12-02 DIAGNOSIS — Z91128 Patient's intentional underdosing of medication regimen for other reason: Secondary | ICD-10-CM

## 2023-12-02 DIAGNOSIS — T461X6A Underdosing of calcium-channel blockers, initial encounter: Secondary | ICD-10-CM | POA: Diagnosis present

## 2023-12-02 DIAGNOSIS — I252 Old myocardial infarction: Secondary | ICD-10-CM

## 2023-12-02 DIAGNOSIS — I161 Hypertensive emergency: Secondary | ICD-10-CM | POA: Diagnosis present

## 2023-12-02 DIAGNOSIS — Z7901 Long term (current) use of anticoagulants: Secondary | ICD-10-CM

## 2023-12-02 DIAGNOSIS — G8194 Hemiplegia, unspecified affecting left nondominant side: Secondary | ICD-10-CM | POA: Diagnosis present

## 2023-12-02 DIAGNOSIS — Z7982 Long term (current) use of aspirin: Secondary | ICD-10-CM

## 2023-12-02 DIAGNOSIS — R531 Weakness: Principal | ICD-10-CM

## 2023-12-02 DIAGNOSIS — Z87891 Personal history of nicotine dependence: Secondary | ICD-10-CM

## 2023-12-02 DIAGNOSIS — I160 Hypertensive urgency: Secondary | ICD-10-CM

## 2023-12-02 DIAGNOSIS — T464X6A Underdosing of angiotensin-converting-enzyme inhibitors, initial encounter: Secondary | ICD-10-CM | POA: Diagnosis present

## 2023-12-02 DIAGNOSIS — R29702 NIHSS score 2: Secondary | ICD-10-CM | POA: Diagnosis present

## 2023-12-02 DIAGNOSIS — Z23 Encounter for immunization: Secondary | ICD-10-CM

## 2023-12-02 DIAGNOSIS — Z8673 Personal history of transient ischemic attack (TIA), and cerebral infarction without residual deficits: Secondary | ICD-10-CM

## 2023-12-02 DIAGNOSIS — Z91148 Patient's other noncompliance with medication regimen for other reason: Secondary | ICD-10-CM

## 2023-12-02 DIAGNOSIS — Z8249 Family history of ischemic heart disease and other diseases of the circulatory system: Secondary | ICD-10-CM

## 2023-12-02 DIAGNOSIS — R270 Ataxia, unspecified: Secondary | ICD-10-CM | POA: Diagnosis present

## 2023-12-02 DIAGNOSIS — T504X6A Underdosing of drugs affecting uric acid metabolism, initial encounter: Secondary | ICD-10-CM | POA: Diagnosis present

## 2023-12-02 DIAGNOSIS — Z8419 Family history of other disorders of kidney and ureter: Secondary | ICD-10-CM

## 2023-12-02 DIAGNOSIS — N179 Acute kidney failure, unspecified: Secondary | ICD-10-CM | POA: Diagnosis present

## 2023-12-02 DIAGNOSIS — N1831 Chronic kidney disease, stage 3a: Secondary | ICD-10-CM | POA: Diagnosis present

## 2023-12-02 DIAGNOSIS — Z8679 Personal history of other diseases of the circulatory system: Secondary | ICD-10-CM

## 2023-12-02 DIAGNOSIS — I129 Hypertensive chronic kidney disease with stage 1 through stage 4 chronic kidney disease, or unspecified chronic kidney disease: Secondary | ICD-10-CM | POA: Diagnosis present

## 2023-12-02 DIAGNOSIS — I10 Essential (primary) hypertension: Secondary | ICD-10-CM

## 2023-12-02 DIAGNOSIS — I2510 Atherosclerotic heart disease of native coronary artery without angina pectoris: Secondary | ICD-10-CM | POA: Diagnosis present

## 2023-12-02 DIAGNOSIS — T45516A Underdosing of anticoagulants, initial encounter: Secondary | ICD-10-CM | POA: Diagnosis present

## 2023-12-02 DIAGNOSIS — Z955 Presence of coronary angioplasty implant and graft: Secondary | ICD-10-CM

## 2023-12-02 DIAGNOSIS — I6381 Other cerebral infarction due to occlusion or stenosis of small artery: Secondary | ICD-10-CM | POA: Diagnosis not present

## 2023-12-02 DIAGNOSIS — I639 Cerebral infarction, unspecified: Secondary | ICD-10-CM

## 2023-12-02 DIAGNOSIS — E785 Hyperlipidemia, unspecified: Secondary | ICD-10-CM | POA: Diagnosis present

## 2023-12-02 DIAGNOSIS — I480 Paroxysmal atrial fibrillation: Secondary | ICD-10-CM | POA: Diagnosis present

## 2023-12-02 LAB — CBC WITH DIFFERENTIAL/PLATELET
Abs Immature Granulocytes: 0.03 K/uL (ref 0.00–0.07)
Basophils Absolute: 0 K/uL (ref 0.0–0.1)
Basophils Relative: 0 %
Eosinophils Absolute: 0.3 K/uL (ref 0.0–0.5)
Eosinophils Relative: 3 %
HCT: 44.9 % (ref 39.0–52.0)
Hemoglobin: 15.3 g/dL (ref 13.0–17.0)
Immature Granulocytes: 0 %
Lymphocytes Relative: 22 %
Lymphs Abs: 2 K/uL (ref 0.7–4.0)
MCH: 29.5 pg (ref 26.0–34.0)
MCHC: 34.1 g/dL (ref 30.0–36.0)
MCV: 86.7 fL (ref 80.0–100.0)
Monocytes Absolute: 0.8 K/uL (ref 0.1–1.0)
Monocytes Relative: 9 %
Neutro Abs: 6 K/uL (ref 1.7–7.7)
Neutrophils Relative %: 66 %
Platelets: 236 K/uL (ref 150–400)
RBC: 5.18 MIL/uL (ref 4.22–5.81)
RDW: 13.2 % (ref 11.5–15.5)
WBC: 9.1 K/uL (ref 4.0–10.5)
nRBC: 0 % (ref 0.0–0.2)

## 2023-12-02 LAB — COMPREHENSIVE METABOLIC PANEL WITH GFR
ALT: 44 U/L (ref 0–44)
AST: 34 U/L (ref 15–41)
Albumin: 4.4 g/dL (ref 3.5–5.0)
Alkaline Phosphatase: 78 U/L (ref 38–126)
Anion gap: 11 (ref 5–15)
BUN: 18 mg/dL (ref 8–23)
CO2: 23 mmol/L (ref 22–32)
Calcium: 9.7 mg/dL (ref 8.9–10.3)
Chloride: 101 mmol/L (ref 98–111)
Creatinine, Ser: 1.45 mg/dL — ABNORMAL HIGH (ref 0.61–1.24)
GFR, Estimated: 52 mL/min — ABNORMAL LOW (ref >60)
Glucose, Bld: 118 mg/dL — ABNORMAL HIGH (ref 70–99)
Potassium: 4.1 mmol/L (ref 3.5–5.1)
Sodium: 135 mmol/L (ref 135–145)
Total Bilirubin: 0.7 mg/dL (ref 0.0–1.2)
Total Protein: 7.4 g/dL (ref 6.5–8.1)

## 2023-12-02 LAB — TROPONIN T, HIGH SENSITIVITY: Troponin T High Sensitivity: 17 ng/L (ref 0–19)

## 2023-12-02 MED ORDER — IOHEXOL 350 MG/ML SOLN
75.0000 mL | Freq: Once | INTRAVENOUS | Status: AC | PRN
  Administered 2023-12-02: 75 mL via INTRAVENOUS

## 2023-12-02 MED ORDER — LABETALOL HCL 5 MG/ML IV SOLN
10.0000 mg | Freq: Once | INTRAVENOUS | Status: AC
  Administered 2023-12-02: 10 mg via INTRAVENOUS
  Filled 2023-12-02: qty 4

## 2023-12-02 NOTE — ED Notes (Signed)
 Pt has slight drift on left leg while laying down in room.

## 2023-12-02 NOTE — ED Triage Notes (Signed)
 Pt is here due to fall pta while getting out of car.  Did not hit his head, no LOC.  Pt felt like he was having a little bit of weakness in his legs since he woke up this am.  Pt noted left leg felt a little weaker while walking down stairs after fall around 7pm but both legs are feeling a little weak. No focal weakness in triage.

## 2023-12-02 NOTE — ED Notes (Signed)
 Patient transported to CT

## 2023-12-02 NOTE — ED Notes (Signed)
 Informed Dr. Dreama of pt BP 242/105

## 2023-12-02 NOTE — ED Provider Notes (Signed)
 [facility] Eric Jackson Provider Note   CSN: [id number] Arrival date & time: 12/02/23  2019     Patient presents with: Fall and Hypertension   Eric Jackson is a 73 y.o. male.  {Add pertinent medical, surgical, social history, OB history to HPI:32947} HPI     Prior to Admission medications   Medication Sig Start Date End Date Taking? Authorizing Provider  allopurinol (ZYLOPRIM) 100 MG tablet Take 200 mg by mouth daily.     [provider]  Ascorbic Acid (VITAMIN C PO) Take 1 tablet by mouth daily.      [provider]  atenolol  (TENORMIN ) 50 MG tablet Take 50 mg by mouth daily.    [provider]  atorvastatin (LIPITOR) 80 MG tablet Take 80 mg by mouth daily.    [provider]  colchicine 0.6 MG tablet Take 0.6 mg by mouth 2 (two) times daily as needed.    [provider]  diltiazem  (CARDIZEM ) 30 MG tablet Take 1 tablet (30 mg total) by mouth every 8 (eight) hours as needed. 11/22/18   Patwardhan, Newman PARAS, MD  ELIQUIS  5 MG TABS tablet TAKE 1 TABLET BY MOUTH TWICE A DAY 05/24/19   Patwardhan, Manish J, MD  lisinopril  (ZESTRIL ) 40 MG tablet Take 1 tablet (40 mg total) by mouth daily. 03/22/19 06/20/19  Patwardhan, Newman PARAS, MD  MULTAQ  400 MG tablet TAKE 1 TABLET (400 MG TOTAL) BY MOUTH 2 (TWO) TIMES DAILY WITH A MEAL. 03/26/19   Camnitz, Soyla Lunger, MD    Allergies: Patient has no known allergies.    Review of Systems  Updated Vital Signs BP (!) 196/97   Pulse (!) 58   Temp 98.4 F (36.9 C) (Oral)   Resp 11   SpO2 97%   Physical Exam  (all labs ordered are listed, but only abnormal results are displayed) Labs Reviewed  COMPREHENSIVE METABOLIC PANEL WITH GFR - Abnormal; Notable for the following components:      Result Value   Glucose, Bld 118 (*)    Creatinine, Ser 1.45 (*)    GFR, Estimated 52 (*)    All other components within normal limits  CBC WITH DIFFERENTIAL/PLATELET  TROPONIN T, HIGH  SENSITIVITY    EKG: None  Radiology: DG Chest Portable 1 View Result Date: 12/02/2023 CLINICAL DATA:  Hypertension, leg weakness.  Fall. EXAM: PORTABLE CHEST 1 VIEW COMPARISON:  None available FINDINGS: Heart and mediastinal contours are within normal limits. No focal opacities or effusions. No acute bony abnormality. No pneumothorax. IMPRESSION: No active disease. Electronically Signed   By: Franky Crease M.D.   On: 12/02/2023 21:31    {Document cardiac monitor, telemetry assessment procedure when appropriate:32947} Procedures   Medications Ordered in the ED  labetalol (NORMODYNE) injection 10 mg (10 mg Intravenous Given 12/02/23 2132)  iohexol (OMNIPAQUE) 350 MG/ML injection 75 mL (75 mLs Intravenous Contrast Given 12/02/23 2155)      {Click here for ABCD2, HEART and other calculators REFRESH Note before signing:1}                              Medical Decision Making Amount and/or Complexity of Data Reviewed Labs: ordered. Radiology: ordered.  Risk Prescription drug management.   ***  {Document critical care time when appropriate  Document review of labs and clinical decision tools ie CHADS2VASC2, etc  Document your independent review of radiology images and any outside records  Document your discussion with family members, caretakers and with consultants  Document social determinants of health affecting pt's care  Document your decision making why or why not admission, treatments were needed:32947:::1}   Final diagnoses:  None    ED Discharge Orders     None

## 2023-12-03 ENCOUNTER — Inpatient Hospital Stay (HOSPITAL_COMMUNITY)

## 2023-12-03 ENCOUNTER — Emergency Department (HOSPITAL_COMMUNITY)

## 2023-12-03 ENCOUNTER — Encounter (HOSPITAL_COMMUNITY): Payer: Self-pay | Admitting: Internal Medicine

## 2023-12-03 DIAGNOSIS — T45516A Underdosing of anticoagulants, initial encounter: Secondary | ICD-10-CM | POA: Diagnosis present

## 2023-12-03 DIAGNOSIS — E782 Mixed hyperlipidemia: Secondary | ICD-10-CM | POA: Diagnosis not present

## 2023-12-03 DIAGNOSIS — Z91148 Patient's other noncompliance with medication regimen for other reason: Secondary | ICD-10-CM | POA: Diagnosis not present

## 2023-12-03 DIAGNOSIS — T464X6A Underdosing of angiotensin-converting-enzyme inhibitors, initial encounter: Secondary | ICD-10-CM | POA: Diagnosis present

## 2023-12-03 DIAGNOSIS — R29700 NIHSS score 0: Secondary | ICD-10-CM | POA: Diagnosis not present

## 2023-12-03 DIAGNOSIS — Z87891 Personal history of nicotine dependence: Secondary | ICD-10-CM | POA: Diagnosis not present

## 2023-12-03 DIAGNOSIS — Z8249 Family history of ischemic heart disease and other diseases of the circulatory system: Secondary | ICD-10-CM | POA: Diagnosis not present

## 2023-12-03 DIAGNOSIS — I160 Hypertensive urgency: Secondary | ICD-10-CM | POA: Diagnosis not present

## 2023-12-03 DIAGNOSIS — T461X6A Underdosing of calcium-channel blockers, initial encounter: Secondary | ICD-10-CM | POA: Diagnosis present

## 2023-12-03 DIAGNOSIS — I480 Paroxysmal atrial fibrillation: Secondary | ICD-10-CM | POA: Diagnosis present

## 2023-12-03 DIAGNOSIS — I6389 Other cerebral infarction: Secondary | ICD-10-CM

## 2023-12-03 DIAGNOSIS — I252 Old myocardial infarction: Secondary | ICD-10-CM | POA: Diagnosis not present

## 2023-12-03 DIAGNOSIS — I2510 Atherosclerotic heart disease of native coronary artery without angina pectoris: Secondary | ICD-10-CM | POA: Diagnosis present

## 2023-12-03 DIAGNOSIS — Z7901 Long term (current) use of anticoagulants: Secondary | ICD-10-CM | POA: Diagnosis not present

## 2023-12-03 DIAGNOSIS — Z8419 Family history of other disorders of kidney and ureter: Secondary | ICD-10-CM | POA: Diagnosis not present

## 2023-12-03 DIAGNOSIS — R531 Weakness: Secondary | ICD-10-CM | POA: Diagnosis not present

## 2023-12-03 DIAGNOSIS — I161 Hypertensive emergency: Secondary | ICD-10-CM | POA: Diagnosis present

## 2023-12-03 DIAGNOSIS — Z8679 Personal history of other diseases of the circulatory system: Secondary | ICD-10-CM

## 2023-12-03 DIAGNOSIS — Z91128 Patient's intentional underdosing of medication regimen for other reason: Secondary | ICD-10-CM | POA: Diagnosis not present

## 2023-12-03 DIAGNOSIS — R29702 NIHSS score 2: Secondary | ICD-10-CM

## 2023-12-03 DIAGNOSIS — E7849 Other hyperlipidemia: Secondary | ICD-10-CM | POA: Diagnosis not present

## 2023-12-03 DIAGNOSIS — N1831 Chronic kidney disease, stage 3a: Secondary | ICD-10-CM | POA: Diagnosis present

## 2023-12-03 DIAGNOSIS — I639 Cerebral infarction, unspecified: Secondary | ICD-10-CM | POA: Diagnosis not present

## 2023-12-03 DIAGNOSIS — Z955 Presence of coronary angioplasty implant and graft: Secondary | ICD-10-CM | POA: Diagnosis not present

## 2023-12-03 DIAGNOSIS — I129 Hypertensive chronic kidney disease with stage 1 through stage 4 chronic kidney disease, or unspecified chronic kidney disease: Secondary | ICD-10-CM | POA: Diagnosis present

## 2023-12-03 DIAGNOSIS — Z7982 Long term (current) use of aspirin: Secondary | ICD-10-CM | POA: Diagnosis not present

## 2023-12-03 DIAGNOSIS — N179 Acute kidney failure, unspecified: Secondary | ICD-10-CM | POA: Diagnosis present

## 2023-12-03 DIAGNOSIS — I739 Peripheral vascular disease, unspecified: Secondary | ICD-10-CM | POA: Diagnosis not present

## 2023-12-03 DIAGNOSIS — Z8673 Personal history of transient ischemic attack (TIA), and cerebral infarction without residual deficits: Secondary | ICD-10-CM | POA: Diagnosis not present

## 2023-12-03 DIAGNOSIS — G8194 Hemiplegia, unspecified affecting left nondominant side: Secondary | ICD-10-CM | POA: Diagnosis present

## 2023-12-03 DIAGNOSIS — Z23 Encounter for immunization: Secondary | ICD-10-CM | POA: Diagnosis present

## 2023-12-03 DIAGNOSIS — I6381 Other cerebral infarction due to occlusion or stenosis of small artery: Secondary | ICD-10-CM | POA: Diagnosis present

## 2023-12-03 DIAGNOSIS — E785 Hyperlipidemia, unspecified: Secondary | ICD-10-CM | POA: Diagnosis present

## 2023-12-03 LAB — LIPID PANEL
Cholesterol: 290 mg/dL — ABNORMAL HIGH (ref 0–200)
HDL: 37 mg/dL — ABNORMAL LOW (ref >40)
LDL Cholesterol: 225 mg/dL — ABNORMAL HIGH (ref 0–99)
Total CHOL/HDL Ratio: 7.8 ratio
Triglycerides: 142 mg/dL (ref <150)
VLDL: 28 mg/dL (ref 0–40)

## 2023-12-03 LAB — BASIC METABOLIC PANEL WITH GFR
Anion gap: 16 — ABNORMAL HIGH (ref 5–15)
BUN: 14 mg/dL (ref 8–23)
CO2: 16 mmol/L — ABNORMAL LOW (ref 22–32)
Calcium: 9.3 mg/dL (ref 8.9–10.3)
Chloride: 104 mmol/L (ref 98–111)
Creatinine, Ser: 1.28 mg/dL — ABNORMAL HIGH (ref 0.61–1.24)
GFR, Estimated: 60 mL/min — ABNORMAL LOW (ref >60)
Glucose, Bld: 124 mg/dL — ABNORMAL HIGH (ref 70–99)
Potassium: 3.7 mmol/L (ref 3.5–5.1)
Sodium: 136 mmol/L (ref 135–145)

## 2023-12-03 LAB — ECHOCARDIOGRAM COMPLETE
AR max vel: 2.64 cm2
AV Area VTI: 2.47 cm2
AV Area mean vel: 2.57 cm2
AV Mean grad: 5 mmHg
AV Peak grad: 8.1 mmHg
Ao pk vel: 1.42 m/s
Area-P 1/2: 4.71 cm2
Height: 72 in
S' Lateral: 2.97 cm
Weight: 3520 [oz_av]

## 2023-12-03 LAB — CBC
HCT: 50 % (ref 39.0–52.0)
Hemoglobin: 16.6 g/dL (ref 13.0–17.0)
MCH: 29.3 pg (ref 26.0–34.0)
MCHC: 33.2 g/dL (ref 30.0–36.0)
MCV: 88.3 fL (ref 80.0–100.0)
Platelets: 257 K/uL (ref 150–400)
RBC: 5.66 MIL/uL (ref 4.22–5.81)
RDW: 13.2 % (ref 11.5–15.5)
WBC: 9.9 K/uL (ref 4.0–10.5)
nRBC: 0 % (ref 0.0–0.2)

## 2023-12-03 LAB — TROPONIN T, HIGH SENSITIVITY: Troponin T High Sensitivity: 16 ng/L (ref 0–19)

## 2023-12-03 LAB — SODIUM, URINE, RANDOM: Sodium, Ur: 126 mmol/L

## 2023-12-03 LAB — CREATININE, URINE, RANDOM: Creatinine, Urine: 45 mg/dL

## 2023-12-03 LAB — HEMOGLOBIN A1C
Hgb A1c MFr Bld: 5 % (ref 4.8–5.6)
Mean Plasma Glucose: 96.8 mg/dL

## 2023-12-03 MED ORDER — GADOBUTROL 1 MMOL/ML IV SOLN
10.0000 mL | Freq: Once | INTRAVENOUS | Status: AC | PRN
  Administered 2023-12-03: 10 mL via INTRAVENOUS

## 2023-12-03 MED ORDER — AMLODIPINE BESYLATE 5 MG PO TABS
5.0000 mg | ORAL_TABLET | Freq: Every day | ORAL | Status: DC
  Administered 2023-12-03: 5 mg via ORAL
  Filled 2023-12-03: qty 1

## 2023-12-03 MED ORDER — ROSUVASTATIN CALCIUM 20 MG PO TABS: 20.0000 mg | ORAL_TABLET | Freq: Every day | ORAL | Status: DC

## 2023-12-03 MED ORDER — ACETAMINOPHEN 650 MG RE SUPP: 650.0000 mg | RECTAL | Status: DC | PRN

## 2023-12-03 MED ORDER — SENNOSIDES-DOCUSATE SODIUM 8.6-50 MG PO TABS: 1.0000 | ORAL_TABLET | Freq: Every evening | ORAL | Status: DC | PRN

## 2023-12-03 MED ORDER — HYDRALAZINE HCL 20 MG/ML IJ SOLN
20.0000 mg | Freq: Four times a day (QID) | INTRAMUSCULAR | Status: DC | PRN
  Filled 2023-12-03: qty 1

## 2023-12-03 MED ORDER — SODIUM CHLORIDE 0.9 % IV SOLN: INTRAVENOUS | Status: AC

## 2023-12-03 MED ORDER — ACETAMINOPHEN 160 MG/5ML PO SOLN: 650.0000 mg | ORAL | Status: DC | PRN

## 2023-12-03 MED ORDER — ASPIRIN 81 MG PO TBEC
81.0000 mg | DELAYED_RELEASE_TABLET | Freq: Every day | ORAL | Status: DC
  Administered 2023-12-03 – 2023-12-05 (×3): 81 mg via ORAL
  Filled 2023-12-03 (×3): qty 1

## 2023-12-03 MED ORDER — ATORVASTATIN CALCIUM 40 MG PO TABS
80.0000 mg | ORAL_TABLET | Freq: Every day | ORAL | Status: DC
  Administered 2023-12-03: 80 mg via ORAL
  Filled 2023-12-03: qty 2

## 2023-12-03 MED ORDER — CLOPIDOGREL BISULFATE 75 MG PO TABS
75.0000 mg | ORAL_TABLET | Freq: Every day | ORAL | Status: DC
  Administered 2023-12-03 – 2023-12-05 (×3): 75 mg via ORAL
  Filled 2023-12-03 (×3): qty 1

## 2023-12-03 MED ORDER — ACETAMINOPHEN 325 MG PO TABS
650.0000 mg | ORAL_TABLET | ORAL | Status: DC | PRN
  Administered 2023-12-05: 650 mg via ORAL
  Filled 2023-12-03: qty 2

## 2023-12-03 MED ORDER — ROSUVASTATIN CALCIUM 20 MG PO TABS
40.0000 mg | ORAL_TABLET | Freq: Every day | ORAL | Status: AC
Start: 2023-12-04 — End: ?
  Administered 2023-12-04 – 2023-12-05 (×2): 40 mg via ORAL
  Filled 2023-12-03 (×2): qty 2

## 2023-12-03 MED ORDER — ENOXAPARIN SODIUM 40 MG/0.4ML IJ SOSY
40.0000 mg | PREFILLED_SYRINGE | INTRAMUSCULAR | Status: DC
  Administered 2023-12-03 – 2023-12-05 (×3): 40 mg via SUBCUTANEOUS
  Filled 2023-12-03 (×3): qty 0.4

## 2023-12-03 MED ORDER — HYDRALAZINE HCL 20 MG/ML IJ SOLN: 10.0000 mg | Freq: Four times a day (QID) | INTRAMUSCULAR | Status: DC | PRN

## 2023-12-03 MED ORDER — HYDRALAZINE HCL 20 MG/ML IJ SOLN
20.0000 mg | Freq: Four times a day (QID) | INTRAMUSCULAR | Status: DC | PRN
  Administered 2023-12-03: 20 mg via INTRAVENOUS

## 2023-12-03 MED ORDER — STROKE: EARLY STAGES OF RECOVERY BOOK
Freq: Once | Status: AC
  Administered 2023-12-03: 1
  Filled 2023-12-03: qty 1

## 2023-12-03 NOTE — Progress Notes (Addendum)
 STROKE TEAM PROGRESS NOTE    INTERIM HISTORY/SUBJECTIVE  Patient has remained hemodynamically stable on the high end.  Amlodipine started for blood pressure control.  He states that he is hoping to return home to [facility] via plane on Friday.  OBJECTIVE  CBC    Component Value Date/Time   WBC 9.9 12/03/2023 0627   RBC 5.66 12/03/2023 0627   HGB 16.6 12/03/2023 0627   HCT 50.0 12/03/2023 0627   PLT 257 12/03/2023 0627   MCV 88.3 12/03/2023 0627   MCH 29.3 12/03/2023 0627   MCHC 33.2 12/03/2023 0627   RDW 13.2 12/03/2023 0627   LYMPHSABS 2.0 12/02/2023 2106   MONOABS 0.8 12/02/2023 2106   EOSABS 0.3 12/02/2023 2106   BASOSABS 0.0 12/02/2023 2106    BMET    Component Value Date/Time   NA 136 12/03/2023 0616   K 3.7 12/03/2023 0616   CL 104 12/03/2023 0616   CO2 16 (L) 12/03/2023 0616   GLUCOSE 124 (H) 12/03/2023 0616   BUN 14 12/03/2023 0616   CREATININE 1.28 (H) 12/03/2023 0616   CALCIUM 9.3 12/03/2023 0616   GFRNONAA 60 (L) 12/03/2023 0616    IMAGING past 24 hours MR Brain W and Wo Contrast Result Date: 12/03/2023 EXAM: MRI BRAIN WITH AND WITHOUT CONTRAST 12/03/2023 02:14:00 AM TECHNIQUE: Multiplanar multisequence MRI of the head/brain was performed with and without the administration of 10 mL gadobutrol (GADAVIST) 1 MMOL/ML injection. COMPARISON: Comparison made with prior CT from 12/02/2023. CLINICAL HISTORY: Brain/CNS neoplasm, monitor. FINDINGS: BRAIN AND VENTRICLES: Mild age related cerebral atrophy. Patchy T2/FLAIR hyperintensity involving the periventricular and deep white matter, consistent with chronic small vessel ischemic disease, moderate in nature. Mild patchy involvement of the pons. Few scattered remote lacunar infarcts present about the hemispheres cerebral white matter, deep gray nuclei, and right cerebellum. 1.5 cm acute ischemic nonhemorrhagic infarct seen involving the right thalamocapsular region (series 5, image 80). No associated hemorrhage or mass  effect. No other evidence for acute or subacute ischemia. No acute intracranial hemorrhage. Scattered siderosis noted about the cerebellum, suggesting prior subarachnoid hemorrhage. Chronic microhemorrhage noted within the right occipital lobe. 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 No mass effect or midline shift. No hydrocephalus. No abnormal enhancement. Hypoplastic right vertebral artery not well seen. Major intracranial vessel flow voids are otherwise maintained. ORBITS: Globes and orbital soft tissues within normal limits. SINUSES: Paranasal sinuses are largely clear. No significant mastoid effusion. BONES AND SOFT TISSUES: Bone marrow signal intensity within normal limits. No scalp soft tissue abnormality. IMPRESSION: 1. 1.5 cm acute ischemic nonhemorrhagic infarct involving the right thalamocapsular region. 2. Underlying age-related atrophy with moderate chronic microvascular ischemic disease, with a few scattered remote lacunar infarcts as above. 3. Scattered siderosis about the cerebellum, suggesting prior subarachnoid hemorrhage. Electronically signed by: Morene Hoard MD 12/03/2023 03:10 AM EDT RP Workstation: [id number]   CT ANGIO HEAD NECK W WO CM Result Date: 12/02/2023 EXAM: CTA Head and Neck with Intravenous Contrast. CT Head without Contrast. CLINICAL HISTORY: Stroke/TIA, determine embolic source; left sided discoordination/mild weakness. Pt is here due to fall pta while getting out of car. Did not hit his head, no LOC. Pt felt like he was having a little bit of weakness in his legs since he woke up this am. Pt noted left leg felt a little weaker while walking down stairs after fall around 7pm but both legs are feeling a  little weak. No focal weakness in triage. TECHNIQUE: Axial CTA images of the head and neck performed with intravenous contrast. MIP reconstructed images  were created and reviewed. Axial computed tomography images of the head/brain performed without intravenous contrast. Note: Per PQRS, the description of internal carotid artery percent stenosis, including 0 percent or normal exam, is based on [place] American Symptomatic Carotid Endarterectomy Trial (NASCET) criteria. Dose reduction technique was used including one or more of the following: automated exposure control, adjustment of mA and kV according to patient size, and/or iterative reconstruction. CONTRAST: Without and with; 75 mL (iohexol (OMNIPAQUE) 350 MG/ML injection 75 mL IOHEXOL 350 MG/ML SOLN). COMPARISON: None provided. FINDINGS: CT HEAD: BRAIN: Generalized age related atrophy with moderate chronic microvascular ischemic disease. Few small remote lacunar infarcts about the right basal ganglia and right thalamus. No acute cortically based infarct. No acute intracranial hemorrhage. No mass lesion or midline shift. No high surface air access or fluid collection. No abnormal hyperdense vessel. Calcified atherosclerosis about the skull base. Scalp soft tissues in calvarium within normal limits. Left gaze preference noted. VENTRICLES: No hydrocephalus. ORBITS: The orbits are unremarkable. SINUSES AND MASTOIDS: The paranasal sinuses and mastoid air cells are largely clear. CTA NECK: COMMON CAROTID ARTERIES: Right common carotid artery is patent without dissection. Scattered atheromatous change about the right carotid artery system without hemodynamically significant greater than 50% stenosis. Left common carotid artery is patent without dissection. Scattered atheromatous change about the left carotid artery system without hemodynamically significant greater than 50% stenosis. INTERNAL CAROTID ARTERIES: Right internal carotid artery is patent without dissection. Scattered atheromatous change about the right carotid artery system without hemodynamically significant greater than 50% stenosis. Left internal carotid  artery is patent without dissection. Scattered atheromatous change about the left carotid artery system without hemodynamically significant greater than 50% stenosis. VERTEBRAL ARTERIES: Both vertebral arteries are absent subclavian arteries. No proximal subclavian artery stenosis. Strongly dominant left vertebral artery with a diffusely hypoplastic right vertebral artery. Mild atheromatous change about the vertebral arteries without significant stenosis. No dissection. OTHER: No worrisome osseous lesions. Poor dentition noted. No other acute abnormality within the neck. Visualized aortic arch with normal limits for caliber. Bovine branching pattern noted. Moderate aortic atherosclerosis. No significant stenosis about the origin of the great vessels. Visualized upper chest demonstrates no acute finding. CTA HEAD: ANTERIOR CEREBRAL ARTERIES: Atheromatous change about the carotid siphons without hemodynamically significant stenosis. A1 segments patent bilaterally. Normal anterior carotid complex. Atheromatous change about the ACAs with associated moderate to severe distal A3 stenoses (series 15, image 27). MIDDLE CEREBRAL ARTERIES: Atheromatous change about the M1 segment bilaterally. Associated mild stenosis at the proximal right M1 segment, with additional mild stenosis at the distal left M1 segment (series 11, image 23). No proximal MC branch occlusion. Distal MC branch is perfused and symmetric, although demonstrate small vessel atherosclerotic irregularity. POSTERIOR CEREBRAL ARTERIES: Dominant left V4 segment patent in the vertebral basilar junction without stenosis. Left PICA patent. Right vertebral artery markedly diminutive and attenuated as it crosses into the cranial vault, but remains grossly patent to the takeoff of the right PICA. Right vertebral artery occludes distally. Right PICA grossly patent. Basilar patent without stenosis. Superior cerebellar arteries patent bilaterally. Both PCAs primarily  supplied by the basilar. Atheromatous change about the PCAs bilaterally with associated moderate left P2 stenosis (series 11, image 22). PCAs remain patent otherwise. BASILAR ARTERY: Basilar patent without stenosis. OTHER: No intracranial aneurysm. SOFT TISSUES: No acute finding. No masses or lymphadenopathy. BONES: No acute osseous  abnormality. IMPRESSION: 1. No acute intracranial abnormality. 2. Age-related cerebral atrophy with moderate chronic bifrontal ischemic disease. 3. Negative CTA for acute large vessel occlusion or other emergent finding. 4. 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 Dominant left vertebral artery patent without significant stenosis. 5. Moderate intracranial atherosclerosis, with most notable findings including moderate to severe distal A3 stenoses moderate left P2 stenosis, with mild bilateral M1 stenoses. Electronically signed by: Morene Hoard MD 12/02/2023 10:50 PM EDT RP Workstation: [id number]   DG Chest Portable 1 View Result Date: 12/02/2023 CLINICAL DATA:  Hypertension, leg weakness.  Fall. EXAM: PORTABLE CHEST 1 VIEW COMPARISON:  None available FINDINGS: Heart and mediastinal contours are within normal limits. No focal opacities or effusions. No acute bony abnormality. No pneumothorax. IMPRESSION: No active disease. Electronically Signed   By: Franky Crease M.D.   On: 12/02/2023 21:31    Vitals:   12/03/23 0915 12/03/23 0930 12/03/23 0945 12/03/23 0953  BP: (!) 185/113 (!) 196/119 (!) 176/101 (!) 176/101  Pulse: 88 94 85 76  Resp: 14 12 19 19   Temp:    97.6 F (36.4 C)  TempSrc:    Oral  SpO2: 99% 99% 97% 98%  Weight:      Height:         PHYSICAL EXAM General:  Alert, well-nourished, well-developed patient in no acute distress Psych:  Mood and affect appropriate for situation CV: Regular rate and rhythm on monitor Respiratory:  Regular, unlabored respirations on room air    NEURO:  Mental  Status: AA&Ox3, patient is able to give clear and coherent history Speech/Language: speech is without dysarthria or aphasia.    Cranial Nerves:  II: PERRL. Visual fields full.  III, IV, VI: EOMI. Eyelids elevate symmetrically.  V: Sensation is intact to light touch and symmetrical to face.  VII: Face is symmetrical resting and smiling VIII: hearing intact to voice. IX, X: Palate elevates symmetrically. Phonation is normal.  KP:Dynloizm shrug 5/5. XII: tongue is midline without fasciculations. Motor: 5/5 strength to right upper and lower extremities, proximal and distal, 4+/5 to left upper and lower extremities, proximal and distal Tone: is normal and bulk is normal Sensation- Intact to light touch bilaterally. Coordination: FTN intact bilaterally Gait- deferred  Most Recent NIH  1a Level of Conscious.: 0 1b LOC Questions: 0 1c LOC Commands: 0 2 Best Gaze: 0 3 Visual: 0 4 Facial Palsy: 0 5a Motor Arm - left: 0 5b Motor Arm - Right: 0 6a Motor Leg - Left: 0 6b Motor Leg - Right: 0 7 Limb Ataxia: 0 8 Sensory: 0 9 Best Language: 0 10 Dysarthria: 0 11 Extinct. and Inatten.: 0 TOTAL: 0   ASSESSMENT/PLAN  Eric Jackson is a 73 y.o. male with history of hypertension, hyperlipidemia, tobacco abuse and CAD admitted for acute onset left leg weakness.  Patient is visiting from [place] for a wedding.  He was found on MRI to have a right thalamic capsular infarct.  Physical therapy is currently recommending outpatient therapy, and given this, patient should be able to return home on Friday as scheduled.  He was instructed to see his primary care provider and obtain follow-up with a local neurologist.  NIH on Admission 2  Stroke: Right thalamocapsular infarct, etiology: Small vessel disease CT head No acute abnormality. Small vessel disease. Atrophy.  CTA head & neck no LVO, hypoplastic right vertebral artery with occlusion beyond the takeoff of the right PICA, moderate to severe  distal A3 stenosis, moderate left P2 stenosis and mild bilateral M1 stenoses MRI right thalamocapsular infarct 2D Echo EF 60-65% LDL 225 HgbA1c 5.0 VTE prophylaxis -Lovenox No antithrombotic prior to admission, now on aspirin 81 mg daily and clopidogrel 75 mg daily for 3 weeks and then aspirin alone. Therapy recommendations:  Outpatient PT/OT/ST Disposition: Pending, likely home  Hypertensive urgency Home meds: None Unstable, still high Gradually normalize BP in 3-5 days Add amlodipine 5 mg daily Long term BP goal normotensive   Hyperlipidemia Home meds: None LDL 225, goal < 70 Patient has tried atorvastatin in the past and experienced muscle aches, will switch to rosuvastatin 40 mg daily Continue statin at discharge  Other Stroke Risk Factors Coronary artery disease Advanced age   Other Active Problems AKI Cre 1.45--1.28, on gentle IVF, encourage po intake  Hospital day # 0  Patient seen by NP and then by MD, MD to edit note as needed. Cortney E Everitt [name] , MSN, AGACNP-BC [facility] See Amion for schedule and pager information 12/03/2023 12:57 PM    ATTENDING NOTE: I reviewed above note and agree with the assessment and plan. Pt was seen and examined.   Daughter is at bedside.  Patient lying in bed, AO x 3, neuro essentially intact except decreased left upper extremity sensation and slight weakness around left lower extremity with mild ataxia.  Based on location of stroke, etiology of stroke likely due to small vessel disease especially with uncontrolled risk factors like hypertensive urgency and high LDL.  Now on DAPT and statin.  Gradually normalize BP in 3 to 5 days.  PT recommend outpatient therapy.  Will follow.  For detailed assessment and plan, please refer to above as I have made changes wherever appropriate.   Ary Cummins, MD PhD Stroke Neurology 12/03/2023 3:20 PM   To contact Stroke Continuity provider, please refer to [email or web address]. After  hours, contact General Neurology

## 2023-12-03 NOTE — ED Notes (Signed)
[facility] at bedside to transport patient. 

## 2023-12-03 NOTE — Progress Notes (Addendum)
 Patient arrived to 3W27, Last NIHSS documentation at 0806 from ED RN was 0. With this RN's assessment upon arrival patient's NIHSS score was 2 for LLE drift and decreased sensation on L side of face

## 2023-12-03 NOTE — H&P (Addendum)
 History and Physical    Eric Jackson FMW:[id number] DOB: March 17, 1950 DOA: 12/02/2023  PCP: Patient, No Pcp Per   Patient coming from: Home   Chief Complaint:  Chief Complaint  Patient presents with   Fall   Hypertension   ED TRIAGE note:  Pt is here due to fall pta while getting out of car.  Did not hit his head, no LOC.  Pt felt like he was having a little bit of weakness in his legs since he woke up this am.  Pt noted left leg felt a little weaker while walking down stairs after fall around 7pm but both legs are feeling a little weak. No focal weakness in triage.      HPI:  Eric Jackson is a 73 y.o. male with medical history significant of significant for MI with CAD status post stenting 1990, paroxysmal atrial fibrillation on Eliquis , hyperlipidemia, morbid obesity, and former smoker initially presented to [facility] for concern for bilateral lower extremity weakness and difficulty ambulating.  Patient reported he moved to [place]  and has not seen any physician since he moved there and has not been taking his medications.  Patient was normal state of health until 9 PM however when he woke up in the morning of 10/14 he noticed bilateral lower extremity weakness and difficulty ambulating.  He reported trouble walking that led to a fall.  Patient's daughter reported that he specifically having problem of the left lower extremity.  Patient does not have any tingling, numbness, headache, vision change, speech and swallowing difficulty.   At presentation to ED patient was hypertensive blood pressure 236/113 and bradycardia heart rate 48-54. Lab, CBC unremarkable.  CMP showed elevated creatinine of 1.45 unknown baseline. Troponin x 2 within normal range. EKG showed normal sinus rhythm heart rate 82, nonspecific T abnormality in lateral leads.  MRI showed 1.3 cm acute ischemic nonhemorrhagic stroke of the right thalamocapsular region.. Underlying age-related atrophy  with moderate chronic microvascular ischemic disease, with a few scattered remote lacunar infarcts as above. Scattered siderosis about the cerebellum, suggesting prior subarachnoid hemorrhage.  CT angio head and neck no acute intracranial abnormality. Age-related cerebral atrophy with moderate chronic bifrontal ischemic disease.3. Negative CTA for acute large vessel occlusion or other emergent finding.4. 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 Dominant left vertebral artery patent without significant stenosis. 5. Moderate intracranial atherosclerosis, with most notable findings including moderate to severe distal A3 stenoses moderate left P2 stenosis, with mild bilateral M1 stenoses.  In the ED patient received IV labetalol 10 mg.  [facility] department ED physician Dr. Dreama has been consulted neurology recommended transfer to [facility] as a ER to ER transfer for escalation of the care.  Hospitalist consulted for further eval for management of hypertensive urgency, acute CVA and possible acute kidney injury.   I have informed neurology Dr. Arora for further recommendation.  Per chart review patient used to be on Cardizem , lisinopril , Eliquis , atenolol  and allopurinol however not taking any medication since 2021. Patient reported that before COVID he moved to [place]  and COVID hit and he lost follow-up with primary care physician and never has been establish care.  Today he came to [facility] to add in his son's wedding. Patient stated that he was diagnosed with atrial fibrillation probably in 2019 and was on Eliquis  for 1 year afterward he stopped taking all medications including Eliquis .   Significant labs in the ED: Lab Orders  CBC with Differential         Comprehensive metabolic panel         Lipid panel         Hemoglobin A1c         Basic metabolic panel         Creatinine, urine, random          Sodium, urine, random         CBC       Review of Systems:  Review of Systems  Constitutional:  Negative for chills, fever, malaise/fatigue and weight loss.  Eyes:  Negative for blurred vision and double vision.  Respiratory:  Negative for sputum production and shortness of breath.   Cardiovascular:  Negative for chest pain, palpitations, claudication and leg swelling.  Musculoskeletal:  Negative for falls and neck pain.  Neurological:  Positive for weakness. Negative for dizziness, tingling, tremors, sensory change, speech change, loss of consciousness and headaches.       Bilateral lower extremity weakness left> right  Psychiatric/Behavioral:  The patient is not nervous/anxious.     Past Medical History:  Diagnosis Date   Anxiety    Coronary artery disease    History of atherosclerotic cardiovascular disease    Hyperlipemia    Hypertension    Ischemic heart disease    Situational stress    Tobacco abuse    Ventricular tachycardia [facility])     Past Surgical History:  Procedure Laterality Date   artery  1995   artery collasped 1 sstent placed    CARDIAC CATHETERIZATION  1996   normal EF -- normal LV function -- minimal coronary irregularities with no significant focal disease    CARDIAC CATHETERIZATION  2004   Est. EF of 55-60%placement of x2 stents to the right coronary artery     reports that he quit smoking about 5 years ago. His smoking use included cigarettes. He started smoking about 43 years ago. He has a 38 pack-year smoking history. He has never used smokeless tobacco. He reports current alcohol use. He reports that he does not use drugs.  No Known Allergies  Family History  Problem Relation Age of Onset   Heart failure Sister        open heart surgery   Kidney disease Brother    Heart disease Brother    Coronary artery disease Mother    Heart attack Father    Heart attack Brother     Prior to Admission medications   Medication Sig Start Date End Date  Taking? Authorizing Provider  allopurinol (ZYLOPRIM) 100 MG tablet Take 200 mg by mouth daily.     [provider]  Ascorbic Acid (VITAMIN C PO) Take 1 tablet by mouth daily.      [provider]  atenolol  (TENORMIN ) 50 MG tablet Take 50 mg by mouth daily.    [provider]  atorvastatin (LIPITOR) 80 MG tablet Take 80 mg by mouth daily.    [provider]  colchicine 0.6 MG tablet Take 0.6 mg by mouth 2 (two) times daily as needed.    [provider]  diltiazem  (CARDIZEM ) 30 MG tablet Take 1 tablet (30 mg total) by mouth every 8 (eight) hours as needed. 11/22/18   Patwardhan, Manish J, MD  ELIQUIS  5 MG TABS tablet TAKE 1 TABLET BY MOUTH TWICE A DAY 05/24/19   Patwardhan, Manish J, MD  lisinopril  (ZESTRIL ) 40 MG tablet Take 1 tablet (40 mg total) by mouth daily. 03/22/19 06/20/19  Patwardhan, Manish J, MD  MULTAQ  400 MG tablet TAKE 1 TABLET (400 MG TOTAL) BY MOUTH 2 (TWO) TIMES DAILY WITH A MEAL. 03/26/19   Inocencio Soyla Lunger, MD     Physical Exam: Vitals:   12/03/23 0000 12/03/23 0053 12/03/23 0303 12/03/23 0531  BP: (!) 189/87 (!) 210/112 (!) 212/89 (!) 180/96  Pulse: (!) 48 (!) 58 (!) 58 76  Resp: 20 18 16 18   Temp:  98 F (36.7 C)  98.2 F (36.8 C)  TempSrc:  Oral  Oral  SpO2: 97% 100% 100% 100%  Weight:  99.8 kg    Height:  6' (1.829 m)      Physical Exam Vitals and nursing note reviewed.  Constitutional:      General: He is not in acute distress.    Appearance: He is not ill-appearing.  HENT:     Mouth/Throat:     Mouth: Mucous membranes are moist.  Eyes:     Pupils: Pupils are equal, round, and reactive to light.  Cardiovascular:     Rate and Rhythm: Normal rate and regular rhythm.     Pulses: Normal pulses.     Heart sounds: Normal heart sounds.  Pulmonary:     Effort: Pulmonary effort is normal.     Breath sounds: Normal breath sounds.  Abdominal:     Palpations: Abdomen is soft.  Musculoskeletal:     Cervical back: Neck  supple.     Right lower leg: No edema.     Left lower leg: No edema.  Skin:    General: Skin is warm.     Capillary Refill: Capillary refill takes less than 2 seconds.  Neurological:     Mental Status: He is alert and oriented to person, place, and time.     Cranial Nerves: No cranial nerve deficit.     Sensory: No sensory deficit.     Motor: Weakness present.     Coordination: Coordination normal.  Psychiatric:        Mood and Affect: Mood normal.        Behavior: Behavior normal.      Labs on Admission: I have personally reviewed following labs and imaging studies  CBC: Recent Labs  Lab 12/02/23 2106  WBC 9.1  NEUTROABS 6.0  HGB 15.3  HCT 44.9  MCV 86.7  PLT 236   Basic Metabolic Panel: Recent Labs  Lab 12/02/23 2106  NA 135  K 4.1  CL 101  CO2 23  GLUCOSE 118*  BUN 18  CREATININE 1.45*  CALCIUM 9.7   GFR: Estimated Creatinine Clearance: 57.2 mL/min (A) (by C-G formula based on SCr of 1.45 mg/dL (H)). Liver Function Tests: Recent Labs  Lab 12/02/23 2106  AST 34  ALT 44  ALKPHOS 78  BILITOT 0.7  PROT 7.4  ALBUMIN 4.4   No results for input(s): LIPASE, AMYLASE in the last 168 hours. No results for input(s): AMMONIA in the last 168 hours. Coagulation Profile: No results for input(s): INR, PROTIME in the last 168 hours. Cardiac Enzymes: No results for input(s): CKTOTAL, CKMB, CKMBINDEX, TROPONINI, TROPONINIHS in the last 168 hours. BNP (last 3 results) No results for input(s): BNP in the last 8760 hours. HbA1C: No results for input(s): HGBA1C in the last 72 hours. CBG: No results for input(s): GLUCAP in the last 168 hours. Lipid Profile: No results for input(s): CHOL, HDL, LDLCALC, TRIG, CHOLHDL, LDLDIRECT in the last 72 hours. Thyroid Function Tests: No results for input(s): TSH, T4TOTAL, FREET4, T3FREE,  THYROIDAB in the last 72 hours. Anemia Panel: No results for input(s): VITAMINB12, FOLATE,  FERRITIN, TIBC, IRON, RETICCTPCT in the last 72 hours. Urine analysis: No results found for: COLORURINE, APPEARANCEUR, LABSPEC, PHURINE, GLUCOSEU, HGBUR, BILIRUBINUR, KETONESUR, PROTEINUR, UROBILINOGEN, NITRITE, LEUKOCYTESUR  Radiological Exams on Admission: I have personally reviewed images MR Brain W and Wo Contrast Result Date: 12/03/2023 EXAM: MRI BRAIN WITH AND WITHOUT CONTRAST 12/03/2023 02:14:00 AM TECHNIQUE: Multiplanar multisequence MRI of the head/brain was performed with and without the administration of 10 mL gadobutrol (GADAVIST) 1 MMOL/ML injection. COMPARISON: Comparison made with prior CT from 12/02/2023. CLINICAL HISTORY: Brain/CNS neoplasm, monitor. FINDINGS: BRAIN AND VENTRICLES: Mild age related cerebral atrophy. Patchy T2/FLAIR hyperintensity involving the periventricular and deep white matter, consistent with chronic small vessel ischemic disease, moderate in nature. Mild patchy involvement of the pons. Few scattered remote lacunar infarcts present about the hemispheres cerebral white matter, deep gray nuclei, and right cerebellum. 1.5 cm acute ischemic nonhemorrhagic infarct seen involving the right thalamocapsular region (series 5, image 80). No associated hemorrhage or mass effect. No other evidence for acute or subacute ischemia. No acute intracranial hemorrhage. Scattered siderosis noted about the cerebellum, suggesting prior subarachnoid hemorrhage. Chronic microhemorrhage noted within the right occipital lobe. 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 No mass effect or midline shift. No hydrocephalus. No abnormal enhancement. Hypoplastic right vertebral artery not well seen. Major intracranial vessel flow voids are otherwise maintained. ORBITS: Globes and orbital soft tissues within normal limits. SINUSES: Paranasal sinuses are largely  clear. No significant mastoid effusion. BONES AND SOFT TISSUES: Bone marrow signal intensity within normal limits. No scalp soft tissue abnormality. IMPRESSION: 1. 1.5 cm acute ischemic nonhemorrhagic infarct involving the right thalamocapsular region. 2. Underlying age-related atrophy with moderate chronic microvascular ischemic disease, with a few scattered remote lacunar infarcts as above. 3. Scattered siderosis about the cerebellum, suggesting prior subarachnoid hemorrhage. Electronically signed by: Morene Hoard MD 12/03/2023 03:10 AM EDT RP Workstation: [id number]   CT ANGIO HEAD NECK W WO CM Result Date: 12/02/2023 EXAM: CTA Head and Neck with Intravenous Contrast. CT Head without Contrast. CLINICAL HISTORY: Stroke/TIA, determine embolic source; left sided discoordination/mild weakness. Pt is here due to fall pta while getting out of car. Did not hit his head, no LOC. Pt felt like he was having a little bit of weakness in his legs since he woke up this am. Pt noted left leg felt a little weaker while walking down stairs after fall around 7pm but both legs are feeling a little weak. No focal weakness in triage. TECHNIQUE: Axial CTA images of the head and neck performed with intravenous contrast. MIP reconstructed images were created and reviewed. Axial computed tomography images of the head/brain performed without intravenous contrast. Note: Per PQRS, the description of internal carotid artery percent stenosis, including 0 percent or normal exam, is based on [place] American Symptomatic Carotid Endarterectomy Trial (NASCET) criteria. Dose reduction technique was used including one or more of the following: automated exposure control, adjustment of mA and kV according to patient size, and/or iterative reconstruction. CONTRAST: Without and with; 75 mL (iohexol (OMNIPAQUE) 350 MG/ML injection 75 mL IOHEXOL 350 MG/ML SOLN). COMPARISON: None provided. FINDINGS: CT HEAD: BRAIN: Generalized age related  atrophy with moderate chronic microvascular ischemic disease. Few small remote lacunar infarcts about the right basal ganglia and right thalamus. No acute cortically based infarct. No acute intracranial hemorrhage. No mass lesion  or midline shift. No high surface air access or fluid collection. No abnormal hyperdense vessel. Calcified atherosclerosis about the skull base. Scalp soft tissues in calvarium within normal limits. Left gaze preference noted. VENTRICLES: No hydrocephalus. ORBITS: The orbits are unremarkable. SINUSES AND MASTOIDS: The paranasal sinuses and mastoid air cells are largely clear. CTA NECK: COMMON CAROTID ARTERIES: Right common carotid artery is patent without dissection. Scattered atheromatous change about the right carotid artery system without hemodynamically significant greater than 50% stenosis. Left common carotid artery is patent without dissection. Scattered atheromatous change about the left carotid artery system without hemodynamically significant greater than 50% stenosis. INTERNAL CAROTID ARTERIES: Right internal carotid artery is patent without dissection. Scattered atheromatous change about the right carotid artery system without hemodynamically significant greater than 50% stenosis. Left internal carotid artery is patent without dissection. Scattered atheromatous change about the left carotid artery system without hemodynamically significant greater than 50% stenosis. VERTEBRAL ARTERIES: Both vertebral arteries are absent subclavian arteries. No proximal subclavian artery stenosis. Strongly dominant left vertebral artery with a diffusely hypoplastic right vertebral artery. Mild atheromatous change about the vertebral arteries without significant stenosis. No dissection. OTHER: No worrisome osseous lesions. Poor dentition noted. No other acute abnormality within the neck. Visualized aortic arch with normal limits for caliber. Bovine branching pattern noted. Moderate aortic  atherosclerosis. No significant stenosis about the origin of the great vessels. Visualized upper chest demonstrates no acute finding. CTA HEAD: ANTERIOR CEREBRAL ARTERIES: Atheromatous change about the carotid siphons without hemodynamically significant stenosis. A1 segments patent bilaterally. Normal anterior carotid complex. Atheromatous change about the ACAs with associated moderate to severe distal A3 stenoses (series 15, image 27). MIDDLE CEREBRAL ARTERIES: Atheromatous change about the M1 segment bilaterally. Associated mild stenosis at the proximal right M1 segment, with additional mild stenosis at the distal left M1 segment (series 11, image 23). No proximal MC branch occlusion. Distal MC branch is perfused and symmetric, although demonstrate small vessel atherosclerotic irregularity. POSTERIOR CEREBRAL ARTERIES: Dominant left V4 segment patent in the vertebral basilar junction without stenosis. Left PICA patent. Right vertebral artery markedly diminutive and attenuated as it crosses into the cranial vault, but remains grossly patent to the takeoff of the right PICA. Right vertebral artery occludes distally. Right PICA grossly patent. Basilar patent without stenosis. Superior cerebellar arteries patent bilaterally. Both PCAs primarily supplied by the basilar. Atheromatous change about the PCAs bilaterally with associated moderate left P2 stenosis (series 11, image 22). PCAs remain patent otherwise. BASILAR ARTERY: Basilar patent without stenosis. OTHER: No intracranial aneurysm. SOFT TISSUES: No acute finding. No masses or lymphadenopathy. BONES: No acute osseous abnormality. IMPRESSION: 1. No acute intracranial abnormality. 2. Age-related cerebral atrophy with moderate chronic bifrontal ischemic disease. 3. Negative CTA for acute large vessel occlusion or other emergent finding. 4. 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 Dominant left vertebral artery  patent without significant stenosis. 5. Moderate intracranial atherosclerosis, with most notable findings including moderate to severe distal A3 stenoses moderate left P2 stenosis, with mild bilateral M1 stenoses. Electronically signed by: Morene Hoard MD 12/02/2023 10:50 PM EDT RP Workstation: [id number]   DG Chest Portable 1 View Result Date: 12/02/2023 CLINICAL DATA:  Hypertension, leg weakness.  Fall. EXAM: PORTABLE CHEST 1 VIEW COMPARISON:  None available FINDINGS: Heart and mediastinal contours are within normal limits. No focal opacities or effusions. No acute bony abnormality. No pneumothorax. IMPRESSION: No active disease. Electronically Signed   By: Franky Crease M.D.   On: 12/02/2023  21:31     EKG: My personal interpretation of EKG shows: Normal sinus rhythm heart rate 82, nonspecific T wave abnormality in lateral leads.    Assessment/Plan: Principal Problem:   Acute CVA (cerebrovascular accident) [facility]) Active Problems:   Hypertensive urgency   Hyperlipidemia   Paroxysmal atrial fibrillation (HCC)   History of CAD (coronary artery disease)    Assessment and Plan: Acute CVA Bilateral lower extremity weakness left> right Gait difficulty/unsteady gait History of intracranial hemorrhage-unable to verify with patient -Patient presented emergency department noticing bilateral lower extremity weakness left> right side with difficulty ambulation which she noticed yesterday morning 10/14 after woke up from the sleep.  Last known well 9 PM 10/13.  Patient denies any sensory deficit, tingling, tremor, speech difficulty and swallowing difficulty.  Patient also noticed bilateral lower extremity gait difficulty. - Has been running out of all medications and lost follow-up with PCP for last 4 years. - Presentation to ED patient found hypertensive blood pressure 230/113 and after receiving IV labetalol blood pressure has been improved however still hovering around 190-200 range.   Received IV Levatol 10 mg in the ED heart rate in between 48-56. - CBC unremarkable CMP showing elevated creatinine 1.25 unknown baseline renal function.  Normal troponin x 2.  EKG showed normal sinus rhythm heart rate 82 nonspecific to abnormality. - MRI showed acute CVA and scattered sclerosis cerebellum suggesting prior hemorrhage. -CT angio head and neck no acute endocrine abnormality.  No acute large vessel occlusion.  Dominating left vertebral artery without stenosis.  Moderate [place]ide arthrosclerosis. - Patient is out of window for TNKs. -Neurology Dr. Voncile on board and recommended to admit patient for stroke workup, echocardiogram, check A1c, lipid panel, start aspirin 81 mg PT OT evaluation and allow permissive hypertension for next 24 hours and treat if systolic blood pressure above 779. - Continue neurocheck per stroke protocol. -Starting aspirin 81 mg daily - Restarting Lipitor 80 mg daily.  Check A1c and lipid panel - Consulted PT and OT. -Obtaining echocardiogram - Continue cardiac monitoring for development of any arrhythmia. -Appreciate neurology input regarding patient care.   Hypertensive emergency Essential hypertension-noncompliance with medication -Presentation to ED patient found hypertensive blood pressure 230/113 and after receiving IV labetalol blood pressure has been improved however still hovering around 190-200 range.  Received IV Levatol 10 mg in the ED heart rate in between 48-56. -Flat troponin x 2.  EKG showed normal sinus rhythm with nonspecific T abnormality in setting of hypertension. -Continue permissive hypertension next for next 24 hours in the setting of acute CVA. - Continue IV hydralazine as needed on board. - After the permissive hypertensive window need to initiate oral blood pressure regimen and gradually improve blood pressure (patient used to be on Cardizem , lisinopril , and atenolol  in the past. -Obtain echocardiogram - Continue cardiac  monitoring.   History of paroxysmal atrial fibrillation -Patient has previous history of atrial fibrillation in 2019-2020 used to be on Eliquis  for 1 year per patient report afterward he stopped taking all medications as lost follow-up with PCP as COVID pandemic hit. -EKG showing normal sinus rhythm. - Continue to monitor development of any arrhythmia.    History of CAD status post cardiac stent [id number] Restarting Lipitor and aspirin 81 mg daily. - Once patient out of the window for permissive hypertension need to initiate oral blood pressure regimen.   DVT prophylaxis:  Lovenox Code Status:  Full Code Diet: Heart healthy diet Family Communication:   Family was present at bedside, at  the time of interview. Opportunity was given to ask question and all questions were answered satisfactorily.  Disposition Plan: Pending echocardiogram.  Continue to monitor improvement of blood pressure Consults: Neurology Admission status:   Inpatient, Step Down Unit  Severity of Illness: The appropriate patient status for this patient is INPATIENT. Inpatient status is judged to be reasonable and necessary in order to provide the required intensity of service to ensure the patient's safety. The patient's presenting symptoms, physical exam findings, and initial radiographic and laboratory data in the context of their chronic comorbidities is felt to place them at high risk for further clinical deterioration. Furthermore, it is not anticipated that the patient will be medically stable for discharge from the hospital within 2 midnights of admission.   * I certify that at the point of admission it is my clinical judgment that the patient will require inpatient hospital care spanning beyond 2 midnights from the point of admission due to high intensity of service, high risk for further deterioration and high frequency of surveillance required.DEWAINE    Deandra Gadson, MD [facility]  How to contact the [facility]  Attending or Consulting provider 7A - 7P or covering provider during after hours 7P -7A, for this patient.  Check the care team in [facility] and look for a) attending/consulting [facility] provider listed and b) the [facility] team listed Log into [email or web address] and use [place]'s universal password to access. If you do not have the password, please contact the hospital operator. Locate the [facility] provider you are looking for under [facility] and page to a number that you can be directly reached. If you still have difficulty reaching the provider, please page the [facility] (Director on Call) for the Hospitalists listed on amion for assistance.  12/03/2023, 6:31 AM

## 2023-12-03 NOTE — TOC Initial Note (Signed)
 Transition of Care [facility]) - Initial/Assessment Note    Patient Details  Name: Eric Jackson MRN: [id number] Date of Birth: 1950/04/18  Transition of Care [facility]) CM/SW Contact:    Andrez JULIANNA George, RN Phone Number: 12/03/2023, 3:32 PM  Clinical Narrative:                  Pt is here due to fall pta while getting out of car. Did not hit his head, no LOC. Pt felt like he was having a little bit of weakness in his legs since he woke up this am .  Pt is from [place] here visiting. He is staying with his daughter.  No DME here. Doesn't use any at home.  Pt doesn't drive but has a SO in [place] that can assist with transportation. Pt hasn't been taking any medications at home.   Pt is interested in outpatient therapy in [place]. Awaiting OT eval.  IP Care management following.   Expected Discharge Plan: OP Rehab Barriers to Discharge: Continued Medical Work up   Patient Goals and CMS Choice     Choice offered to / list presented to : Patient      Expected Discharge Plan and Services   Discharge Planning Services: CM Consult   Living arrangements for the past 2 months: Single Family Home                                      Prior Living Arrangements/Services Living arrangements for the past 2 months: Single Family Home Lives with:: Significant Other Patient language and need for interpreter reviewed:: Yes Do you feel safe going back to the place where you live?: Yes        Care giver support system in place?: Yes (comment)   Criminal Activity/Legal Involvement Pertinent to Current Situation/Hospitalization: No - Comment as needed  Activities of Daily Living   ADL Screening (condition at time of admission) Independently performs ADLs?: No Does the patient have a NEW difficulty with bathing/dressing/toileting/self-feeding that is expected to last >3 days?: Yes (Initiates electronic notice to provider for possible OT consult) Does the patient have a NEW difficulty  with getting in/out of bed, walking, or climbing stairs that is expected to last >3 days?: Yes (Initiates electronic notice to provider for possible PT consult) Does the patient have a NEW difficulty with communication that is expected to last >3 days?: No Is the patient deaf or have difficulty hearing?: No Does the patient have difficulty seeing, even when wearing glasses/contacts?: No Does the patient have difficulty concentrating, remembering, or making decisions?: No  Permission Sought/Granted                  Emotional Assessment Appearance:: Appears stated age Attitude/Demeanor/Rapport: Engaged Affect (typically observed): Accepting Orientation: : Oriented to Self, Oriented to Place, Oriented to  Time, Oriented to Situation   Psych Involvement: No (comment)  Admission diagnosis:  Acute ischemic stroke (HCC) [I63.9] Left-sided weakness [R53.1] Acute CVA (cerebrovascular accident) (HCC) [I63.9] Hypertension, unspecified type [I10] Patient Active Problem List   Diagnosis Date Noted   Hypertensive urgency 12/03/2023   History of CAD (coronary artery disease) 12/03/2023   Acute CVA (cerebrovascular accident) (HCC) 12/03/2023   Paroxysmal atrial fibrillation (HCC) 12/07/2018   Coronary artery disease involving native coronary artery of native heart without angina pectoris 12/07/2018   Nonspecific abnormal electrocardiogram (ECG) (EKG) 11/20/2018   Hyperlipidemia 11/20/2018  HTN (hypertension) 11/20/2018   Macular degeneration 11/20/2018   Palpitations 11/20/2018   PCP:  Patient, No Pcp Per Pharmacy:   [id number] - [place], [place] - [address] [place] [address] Phone: [phone] Fax: [phone]  [facility] [address] [place] [address] Phone: [phone] Fax: [phone]     Social Drivers of Health (SDOH) Social History: SDOH Screenings   Food Insecurity: No Food Insecurity  (12/03/2023)  Housing: Low Risk  (12/03/2023)  Transportation Needs: No Transportation Needs (12/03/2023)  Utilities: Not At Risk (12/03/2023)  Social Connections: Moderately Isolated (12/03/2023)  Tobacco Use: Medium Risk (12/03/2023)   SDOH Interventions:     Readmission Risk Interventions     No data to display

## 2023-12-03 NOTE — ED Provider Notes (Signed)
  Provider Note MRN:  [id number]  Arrival date & time: 12/03/23    ED Course and Medical Decision Making  Assumed care of patient at sign-out or upon transfer.  Extremity weakness, hypertension, sent here for MRI imaging to evaluate for stroke.  Well-appearing on my assessment, still feeling unsteady on his feet.   4 AM update: MRI confirms acute ischemic stroke.  Will admit to medicine.  Neurology following. Procedures  Final Clinical Impressions(s) / ED Diagnoses     ICD-10-CM   1. Left-sided weakness  R53.1     2. Hypertension, unspecified type  I10     3. Acute ischemic stroke [facility])  I63.9       ED Discharge Orders     None       Discharge Instructions   None     Ozell HERO. Theadore, MD [facility] Emergency Medicine [facility] [email or web address]    Theadore Ozell HERO, MD 12/03/23 [phone]

## 2023-12-03 NOTE — Evaluation (Signed)
 Physical Therapy Evaluation Patient Details Name: Eric Jackson MRN: [id number] DOB: [birth year] Today's Date: 12/03/2023  History of Present Illness  Eric Jackson is a 73 y.o. male admitted 10/14  with weakness both legs which progressed to numbness/weakness left LE per pt. MRI brain: 1.5 cm infarct in the right thalamocapsular region.  Underlying age-related atrophy and moderate chronic microvascular ischemic disease with few scattered remote lacunar infarcts.  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 Pt with deficits affecting left LE coordination/balance. Pt currently needing min assist and cues for safety as well as use of RW.  Pt was I PTA without any symptoms.  Pt was in [place] for a wedding and is supposed to go home to [facility] via a flight on Friday.  Pt is traveling with girlfriend Eric Jackson and she can assist at all times.  Pt will likely need a RW and gait belt and Eric Jackson can assist him to get to wheelchair at airport and he can make it home when medically stable and use RW initially at home.  Outpt PT recommended.  Will follow acutely.  Pt currently with functional limitations due to the deficits listed below (see PT Problem List). Pt will benefit from acute skilled PT to increase their independence and safety with mobility to allow discharge.           If plan is discharge home, recommend the following: A little help with walking and/or transfers;A little help with bathing/dressing/bathroom;Assistance with cooking/housework;Assist for transportation;Help with stairs or ramp for entrance   Can travel by private vehicle        Equipment Recommendations Rolling walker (2 wheels)  Recommendations for Other Services       Functional Status Assessment Patient has had a recent decline in their  functional status and demonstrates the ability to make significant improvements in function in a reasonable and predictable amount of time.     Precautions / Restrictions Precautions Precautions: Fall Restrictions Weight Bearing Restrictions Per Provider Order: No      Mobility  Bed Mobility Overal bed mobility: Independent                  Transfers Overall transfer level: Needs assistance Equipment used: Rolling walker (2 wheels) Transfers: Sit to/from Stand Sit to Stand: Contact guard assist           General transfer comment: Slightly impulsive with movement at times. Needs cues for safety    Ambulation/Gait Ambulation/Gait assistance: Min assist Gait Distance (Feet): 125 Feet Assistive device: Rolling walker (2 wheels) Gait Pattern/deviations: Decreased step length - left, Decreased stride length, Decreased dorsiflexion - left, Ataxic, Drifts right/left   Gait velocity interpretation: <1.31 ft/sec, indicative of household ambulator   General Gait Details: Pt had difficulty sequencing steps with poor coordination at times needing cues and assist. Drift to left and pt unaware of deficits. Also left LE weakness noted with pt having unequal step lenngth at times. Pt states that left LE is intermittently numb as well.  Pt did do better with RW however definite need for steadying assist at times.  Stairs            Wheelchair Mobility     Tilt Bed    Modified Rankin (Stroke Patients Only) Modified Rankin (Stroke Patients Only) Pre-Morbid Rankin Score: No symptoms Modified Rankin: Moderately severe disability  Balance Overall balance assessment: Needs assistance Sitting-balance support: No upper extremity supported, Feet supported Sitting balance-Leahy Scale: Good     Standing balance support: Bilateral upper extremity supported, During functional activity, Reliant on assistive device for balance Standing balance-Leahy Scale: Poor Standing  balance comment: relies on RW for support                             Pertinent Vitals/Pain Pain Assessment Pain Assessment: No/denies pain    Home Living Family/patient expects to be discharged to:: Private residence Living Arrangements: Spouse/significant other;Other (Comment) (GF Eric Jackson) Available Help at Discharge: Family;Available PRN/intermittently (GF works 430 am - 2 pm; some family in [place] that can help) Type of Home: House Home Access: Stairs to enter Entrance Stairs-Rails: Right;Left;Can reach both Entrance Stairs-Number of Steps: 5   Home Layout: One level Home Equipment: None Additional Comments: Pt is from [place] and was here for wedding and plans to fly back on Friday. Possibly has a cane here in [place].  Doesnt drive due to low vision per pt    Prior Function Prior Level of Function : Independent/Modified Independent (low vision so Eric Jackson drives)             Mobility Comments: I PTA; walked 4-5 miles day ADLs Comments: B/D self     Extremity/Trunk Assessment   Upper Extremity Assessment Upper Extremity Assessment: Defer to OT evaluation    Lower Extremity Assessment Lower Extremity Assessment: LLE deficits/detail LLE Deficits / Details: grossly 4-5 LLE Sensation: decreased light touch LLE Coordination: decreased fine motor;decreased gross motor    Cervical / Trunk Assessment Cervical / Trunk Assessment: Normal  Communication   Communication Communication: No apparent difficulties    Cognition Arousal: Alert Behavior During Therapy: Flat affect   PT - Cognitive impairments: Orientation   Orientation impairments:  Eric Jackson august when questioned but then said October)                     Following commands: Intact       Cueing Cueing Techniques: Tactile cues, Verbal cues     General Comments General comments (skin integrity, edema, etc.): 89 bpm, 98%RA, 177/96 supine, sitting 104 bvpm, 97% rA, after walk 215/119,  86 bpm, 98%; 201/102 once supine.    Exercises     Assessment/Plan    PT Assessment Patient needs continued PT services  PT Problem List Decreased activity tolerance;Decreased balance;Decreased strength;Decreased mobility;Decreased knowledge of use of DME;Decreased safety awareness;Decreased knowledge of precautions;Cardiopulmonary status limiting activity       PT Treatment Interventions DME instruction;Gait training;Functional mobility training;Therapeutic activities;Therapeutic exercise;Balance training;Patient/family education    PT Goals (Current goals can be found in the Care Plan section)  Acute Rehab PT Goals Patient Stated Goal: to get home to [facility] PT Goal Formulation: With patient Time For Goal Achievement: 12/17/23 Potential to Achieve Goals: Good    Frequency Min 2X/week     Co-evaluation               AM-PAC PT 6 Clicks Mobility  Outcome Measure Help needed turning from your back to your side while in a flat bed without using bedrails?: None Help needed moving from lying on your back to sitting on the side of a flat bed without using bedrails?: None Help needed moving to and from a bed to a chair (including a wheelchair)?: A Little Help needed standing up from a chair using your arms (e.g., wheelchair  or bedside chair)?: A Little Help needed to walk in hospital room?: A Little Help needed climbing 3-5 steps with a railing? : Total 6 Click Score: 18    End of Session Equipment Utilized During Treatment: Gait belt Activity Tolerance: Patient limited by fatigue Patient left: with call bell/phone within reach (on stretcher) Nurse Communication: Mobility status PT Visit Diagnosis: Muscle weakness (generalized) (M62.81)    Time: 8893-8864 PT Time Calculation (min) (ACUTE ONLY): 29 min   Charges:   PT Evaluation $PT Eval Moderate Complexity: 1 Mod PT Treatments $Gait Training: 8-22 mins PT General Charges $$ ACUTE PT VISIT: 1 Visit          Jsean Taussig M,PT Acute Rehab Services [phone]   Stephane JULIANNA Bevel 12/03/2023, 12:30 PM

## 2023-12-03 NOTE — ED Triage Notes (Signed)
 Patient transferred from [facility] for MRI. Patient is from out of town, had a fall getting out of his car. Bilateral leg weakness. CT done at [facility] was negative so here for further MRI scan. Hypertensive with [facility].

## 2023-12-03 NOTE — Plan of Care (Signed)

## 2023-12-03 NOTE — Consult Note (Signed)
 NEUROLOGY CONSULT NOTE   Date of service: December 03, 2023 Patient Name: Eric Jackson MRN:  [id number] DOB:  October 07, 1950 Chief Complaint: Left-sided weakness, gait disturbance Requesting Provider: Sundil, Subrina, MD  History of Present Illness  Eric Jackson is a 73 y.o. male with hx of hypertension, hyperlipidemia, tobacco abuse, coronary artery disease, not taking any medications-who was in his usual state of health and went to bed at around 9 PM on Monday, 12/01/2023, woke up on Tuesday morning with some weakness in his legs, initially he felt both legs were weak but it seemed like his left leg was the one that was dragging when he was trying to walk.  He came for evaluation to a freestanding ER.  Dr. Dreama had called me regarding further workup since she did feel that he had left-sided focal deficits.  He was outside the window for any intervention by the time he was seen in the ED at [facility].  I recommended an MRI be done since based on the description, stroke was high in the differentials.  He was also hypertensive and hypertensive urgency could also be making his symptoms worse. He was transferred to [facility] emergency department where an MRI was done that showed an acute infarct. Neurology was asked to formally consult  LKW: 9 PM 12/01/2023 Modified rankin score: 0-Completely asymptomatic and back to baseline post- stroke IV Thrombolysis: Outside the window EVT: Outside the window, stroke appears small vessel etiology  NIHSS components Score: Comment  1a Level of Conscious 0[x]  1[]  2[]  3[]      1b LOC Questions 0[x]  1[]  2[]       1c LOC Commands 0[x]  1[]  2[]       2 Best Gaze 0[x]  1[]  2[]       3 Visual 0[x]  1[]  2[]  3[]      4 Facial Palsy 0[x]  1[]  2[]  3[]      5a Motor Arm - left 0[x]  1[]  2[]  3[]  4[]  UN[]    5b Motor Arm - Right 0[]  1[x]  2[]  3[]  4[]  UN[]    6a Motor Leg - Left 0[x]  1[]  2[]  3[]  4[]  UN[]    6b Motor Leg - Right 0[x]  1[]  2[]  3[]  4[]  UN[]    7 Limb Ataxia 0[x]  1[]   2[]  UN[]      8 Sensory 0[]  1[x]  2[]  UN[]      9 Best Language 0[x]  1[]  2[]  3[]      10 Dysarthria 0[x]  1[]  2[]  UN[]      11 Extinct. and Inattention 0[x]  1[]  2[]       TOTAL: 2      ROS  Comprehensive ROS performed and pertinent positives documented in HPI   Past History   Past Medical History:  Diagnosis Date   Anxiety    Coronary artery disease    History of atherosclerotic cardiovascular disease    Hyperlipemia    Hypertension    Ischemic heart disease    Situational stress    Tobacco abuse    Ventricular tachycardia (HCC)     Past Surgical History:  Procedure Laterality Date   artery  1995   artery collasped 1 sstent placed    CARDIAC CATHETERIZATION  1996   normal EF -- normal LV function -- minimal coronary irregularities with no significant focal disease    CARDIAC CATHETERIZATION  2004   Est. EF of 55-60%placement of x2 stents to the right coronary artery    Family History: Family History  Problem Relation Age of Onset   Heart failure Sister  open heart surgery   Kidney disease Brother    Heart disease Brother    Coronary artery disease Mother    Heart attack Father    Heart attack Brother     Social History  reports that he quit smoking about 5 years ago. His smoking use included cigarettes. He started smoking about 43 years ago. He has a 38 pack-year smoking history. He has never used smokeless tobacco. He reports current alcohol use. He reports that he does not use drugs.  No Known Allergies  Medications   Current Facility-Administered Medications:    [START ON 12/04/2023]  stroke: early stages of recovery book, , Does not apply, Once, Sundil, Subrina, MD   0.9 %  sodium chloride infusion, , Intravenous, Continuous, Sundil, Subrina, MD   acetaminophen (TYLENOL) tablet 650 mg, 650 mg, Oral, Q4H PRN **OR** acetaminophen (TYLENOL) 160 MG/5ML solution 650 mg, 650 mg, Per Tube, Q4H PRN **OR** acetaminophen (TYLENOL) suppository 650 mg, 650 mg, Rectal,  Q4H PRN, Sundil, Subrina, MD   atorvastatin (LIPITOR) tablet 80 mg, 80 mg, Oral, Daily, Sundil, Subrina, MD   enoxaparin (LOVENOX) injection 40 mg, 40 mg, Subcutaneous, Q24H, Sundil, Subrina, MD   hydrALAZINE (APRESOLINE) injection 20 mg, 20 mg, Intravenous, Q6H PRN, Sundil, Subrina, MD   senna-docusate (Senokot-S) tablet 1 tablet, 1 tablet, Oral, QHS PRN, Sundil, Subrina, MD  Current Outpatient Medications:    allopurinol (ZYLOPRIM) 100 MG tablet, Take 200 mg by mouth daily. , Disp: , Rfl:    Ascorbic Acid (VITAMIN C PO), Take 1 tablet by mouth daily.  , Disp: , Rfl:    atenolol  (TENORMIN ) 50 MG tablet, Take 50 mg by mouth daily., Disp: , Rfl:    atorvastatin (LIPITOR) 80 MG tablet, Take 80 mg by mouth daily., Disp: , Rfl:    colchicine 0.6 MG tablet, Take 0.6 mg by mouth 2 (two) times daily as needed., Disp: , Rfl:    diltiazem  (CARDIZEM ) 30 MG tablet, Take 1 tablet (30 mg total) by mouth every 8 (eight) hours as needed., Disp: 30 tablet, Rfl: 2   ELIQUIS  5 MG TABS tablet, TAKE 1 TABLET BY MOUTH TWICE A DAY, Disp: 180 tablet, Rfl: 1   lisinopril  (ZESTRIL ) 40 MG tablet, Take 1 tablet (40 mg total) by mouth daily., Disp: 90 tablet, Rfl: 3   MULTAQ  400 MG tablet, TAKE 1 TABLET (400 MG TOTAL) BY MOUTH 2 (TWO) TIMES DAILY WITH A MEAL., Disp: 180 tablet, Rfl: 3  Vitals   Vitals:   12/02/23 2345 12/03/23 0000 12/03/23 0053 12/03/23 0303  BP: (!) 196/86 (!) 189/87 (!) 210/112 (!) 212/89  Pulse: (!) 54 (!) 48 (!) 58 (!) 58  Resp: 15 20 18 16   Temp:   98 F (36.7 C)   TempSrc:   Oral   SpO2:  97% 100% 100%  Weight:   99.8 kg   Height:   6' (1.829 m)     Body mass index is 29.84 kg/m.   Physical Exam   Constitutional: Appears well-developed and well-nourished.  Psych: Affect appropriate to situation.  Eyes: No scleral injection.  HENT: No OP obstruction.  Head: Normocephalic.  Cardiovascular: Normal rate and regular rhythm.  Respiratory: Effort normal, non-labored breathing.  GI:  Soft.  No distension. There is no tenderness.  Skin: WDI.   Neurologic Examination  Awake alert oriented x 3 No dysarthria or aphasia Cranial nerves: Pupils equal round react light, extraocular movements intact, visual fields full, face symmetric, facial sensation somewhat subjectively different on  the left, auditory acuity intact, tongue and palate midline. Motor examination with no drift in the upper extremities but there is a mild drift in the left lower extremity. Sensation intact to light touch Coordination examination with no dysmetria disproportionate to weakness   Labs/Imaging/Neurodiagnostic studies   CBC:  Recent Labs  Lab 2023/12/22 2106  WBC 9.1  NEUTROABS 6.0  HGB 15.3  HCT 44.9  MCV 86.7  PLT 236   Basic Metabolic Panel:  Lab Results  Component Value Date   NA 135 12-22-23   K 4.1 22-Dec-2023   CO2 23 2023/12/22   GLUCOSE 118 (H) Dec 22, 2023   BUN 18 12/22/23   CREATININE 1.45 (H) 2023-12-22   CALCIUM 9.7 22-Dec-2023   GFRNONAA 52 (L) 22-Dec-2023   Imaging personally reviewed CT head and CT angio head negative for LVO or acute intracranial process.  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  Dominant left vert.  Moderate intracranial atherosclerosis and multiple vascular territories MRI brain: 1.5 cm infarct in the right thalamocapsular region.  Underlying age-related atrophy and moderate chronic microvascular ischemic disease with few scattered remote lacunar infarcts.  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 male with uncontrolled cerebrovascular risk factors presenting for sudden onset of gait difficulty and left-sided weakness.  MRI brain shows a right thalamic capsular acute ischemic infarct. Etiology likely small vessel disease No emergent LVO on CTA head and neck  Impression: Acute  ischemic stroke-likely small vessel etiology  RECOMMENDATIONS  Admit to hospitalist Frequent neurochecks Telemetry Already has obtained CTA head and neck at the freestanding ER-reviewed and above. 2D echo A1c Lipid panel Aspirin 81 for now Therapy assessments Permissive hypertension-treat only if systolic blood pressures greater than 220 on a as needed basis for the next 24 to 48 hours.  Eventual blood pressure goal is normotension. Stroke neurology to follow Plan relayed to Dr. Lee ______________________________________________________________________    Signed, Eligio Lav, MD [facility]

## 2023-12-03 NOTE — Progress Notes (Signed)
 Patient admitted earlier this am.    Eric Jackson is a 73 y.o. male with medical history significant of significant for MI with CAD status post stenting 1990, paroxysmal atrial fibrillation on Eliquis , hyperlipidemia, morbid obesity, and former smoker initially presented to [facility] emergency department for concern for bilateral lower extremity weakness and difficulty ambulating.  He was admitted for stroke work up .  Neurology on board.    General exam: Appears calm and comfortable  Respiratory system: Clear to auscultation. Respiratory effort normal. Cardiovascular system: S1 & S2 heard, RRR.  Gastrointestinal system: Abdomen is nondistended, soft and nontender. No organomegaly or masses felt. Normal bowel sounds heard. Central nervous system: Alert and oriented.  Left facial numbness is improving. Left leg weakness improving.  Extremities: Symmetric 5 x 5 power. Skin: No rashes, lesions or ulcers Psychiatry: . Mood & affect appropriate.     Elek Holderness, MD

## 2023-12-04 ENCOUNTER — Other Ambulatory Visit (HOSPITAL_COMMUNITY): Payer: Self-pay

## 2023-12-04 DIAGNOSIS — I739 Peripheral vascular disease, unspecified: Secondary | ICD-10-CM

## 2023-12-04 DIAGNOSIS — R29700 NIHSS score 0: Secondary | ICD-10-CM

## 2023-12-04 DIAGNOSIS — I639 Cerebral infarction, unspecified: Secondary | ICD-10-CM | POA: Diagnosis not present

## 2023-12-04 DIAGNOSIS — I160 Hypertensive urgency: Secondary | ICD-10-CM | POA: Diagnosis not present

## 2023-12-04 DIAGNOSIS — E782 Mixed hyperlipidemia: Secondary | ICD-10-CM

## 2023-12-04 DIAGNOSIS — I10 Essential (primary) hypertension: Secondary | ICD-10-CM

## 2023-12-04 DIAGNOSIS — Z7902 Long term (current) use of antithrombotics/antiplatelets: Secondary | ICD-10-CM

## 2023-12-04 DIAGNOSIS — Z7982 Long term (current) use of aspirin: Secondary | ICD-10-CM

## 2023-12-04 DIAGNOSIS — I6389 Other cerebral infarction: Secondary | ICD-10-CM | POA: Diagnosis not present

## 2023-12-04 DIAGNOSIS — E785 Hyperlipidemia, unspecified: Secondary | ICD-10-CM

## 2023-12-04 LAB — CREATININE, SERUM
Creatinine, Ser: 1.59 mg/dL — ABNORMAL HIGH (ref 0.61–1.24)
GFR, Estimated: 46 mL/min — ABNORMAL LOW (ref >60)

## 2023-12-04 MED ORDER — AMLODIPINE BESYLATE 10 MG PO TABS
10.0000 mg | ORAL_TABLET | Freq: Every day | ORAL | 3 refills | Status: AC
  Filled 2023-12-04: qty 30, 30d supply, fill #0

## 2023-12-04 MED ORDER — CARVEDILOL 6.25 MG PO TABS
6.2500 mg | ORAL_TABLET | Freq: Two times a day (BID) | ORAL | Status: DC
  Administered 2023-12-04 – 2023-12-05 (×2): 6.25 mg via ORAL
  Filled 2023-12-04 (×2): qty 1

## 2023-12-04 MED ORDER — HYDRALAZINE HCL 25 MG PO TABS
25.0000 mg | ORAL_TABLET | Freq: Four times a day (QID) | ORAL | Status: DC | PRN
  Administered 2023-12-04: 25 mg via ORAL
  Filled 2023-12-04: qty 1

## 2023-12-04 MED ORDER — PNEUMOCOCCAL 20-VAL CONJ VACC 0.5 ML IM SUSY
0.5000 mL | PREFILLED_SYRINGE | INTRAMUSCULAR | Status: AC | PRN
  Administered 2023-12-05: 0.5 mL via INTRAMUSCULAR
  Filled 2023-12-04: qty 0.5

## 2023-12-04 MED ORDER — ASPIRIN 81 MG PO TBEC
81.0000 mg | DELAYED_RELEASE_TABLET | Freq: Every day | ORAL | 12 refills | Status: DC
  Filled 2023-12-04: qty 30, 30d supply, fill #0

## 2023-12-04 MED ORDER — ROSUVASTATIN CALCIUM 40 MG PO TABS
40.0000 mg | ORAL_TABLET | Freq: Every day | ORAL | 3 refills | Status: AC
  Filled 2023-12-04: qty 30, 30d supply, fill #0

## 2023-12-04 MED ORDER — CLOPIDOGREL BISULFATE 75 MG PO TABS
75.0000 mg | ORAL_TABLET | Freq: Every day | ORAL | 0 refills | Status: DC
  Filled 2023-12-04: qty 21, 21d supply, fill #0

## 2023-12-04 MED ORDER — AMLODIPINE BESYLATE 5 MG PO TABS
10.0000 mg | ORAL_TABLET | Freq: Every day | ORAL | Status: DC
  Administered 2023-12-04 – 2023-12-05 (×2): 10 mg via ORAL
  Filled 2023-12-04 (×2): qty 2

## 2023-12-04 MED ORDER — INFLUENZA VAC SPLIT HIGH-DOSE 0.5 ML IM SUSY
0.5000 mL | PREFILLED_SYRINGE | INTRAMUSCULAR | Status: AC | PRN
  Administered 2023-12-05: 0.5 mL via INTRAMUSCULAR
  Filled 2023-12-04: qty 0.5

## 2023-12-04 NOTE — Progress Notes (Addendum)
 STROKE TEAM PROGRESS NOTE    INTERIM HISTORY/SUBJECTIVE  Blood pressure has improved since yesterday, but patient is still hypertensive.  Instructed him to follow-up with his primary care provider for better control of hypertension and hyperlipidemia.  Patient reports that he is going to delay returning home to [facility] and stay with family for about a week.  OBJECTIVE  CBC    Component Value Date/Time   WBC 9.9 12/03/2023 0627   RBC 5.66 12/03/2023 0627   HGB 16.6 12/03/2023 0627   HCT 50.0 12/03/2023 0627   PLT 257 12/03/2023 0627   MCV 88.3 12/03/2023 0627   MCH 29.3 12/03/2023 0627   MCHC 33.2 12/03/2023 0627   RDW 13.2 12/03/2023 0627   LYMPHSABS 2.0 12/02/2023 2106   MONOABS 0.8 12/02/2023 2106   EOSABS 0.3 12/02/2023 2106   BASOSABS 0.0 12/02/2023 2106    BMET    Component Value Date/Time   NA 136 12/03/2023 0616   K 3.7 12/03/2023 0616   CL 104 12/03/2023 0616   CO2 16 (L) 12/03/2023 0616   GLUCOSE 124 (H) 12/03/2023 0616   BUN 14 12/03/2023 0616   CREATININE 1.59 (H) 12/04/2023 1300   CALCIUM 9.3 12/03/2023 0616   GFRNONAA 46 (L) 12/04/2023 1300    IMAGING past 24 hours No results found.   Vitals:   12/04/23 0331 12/04/23 0800 12/04/23 1228 12/04/23 1345  BP: (!) 152/112 (!) 193/109 (!) 188/109 (!) 173/102  Pulse: 98 83 81   Resp:   20   Temp: 98.3 F (36.8 C) 98 F (36.7 C) 98.4 F (36.9 C)   TempSrc: Oral  Oral   SpO2: 97% 92% 97%   Weight:      Height:         PHYSICAL EXAM General:  Alert, well-nourished, well-developed patient in no acute distress Psych:  Mood and affect appropriate for situation CV: Regular rate and rhythm on monitor Respiratory:  Regular, unlabored respirations on room air    NEURO:  Mental Status: AA&Ox3, patient is able to give clear and coherent history Speech/Language: speech is without dysarthria or aphasia.    Cranial Nerves:  II: PERRL. Visual fields full.  III, IV, VI: EOMI. Eyelids elevate  symmetrically.  V: Sensation is intact to light touch and symmetrical to face.  VII: Face is symmetrical resting and smiling VIII: hearing intact to voice. IX, X: Palate elevates symmetrically. Phonation is normal.  KP:Dynloizm shrug 5/5. XII: tongue is midline without fasciculations. Motor: 5/5 strength to right upper and lower extremities, proximal and distal, 4+/5 to left upper and lower extremities, proximal and distal Tone: is normal and bulk is normal Sensation- Intact to light touch bilaterally. Coordination: FTN intact bilaterally Gait- deferred  Most Recent NIH  1a Level of Conscious.: 0 1b LOC Questions: 0 1c LOC Commands: 0 2 Best Gaze: 0 3 Visual: 0 4 Facial Palsy: 0 5a Motor Arm - left: 0 5b Motor Arm - Right: 0 6a Motor Leg - Left: 0 6b Motor Leg - Right: 0 7 Limb Ataxia: 0 8 Sensory: 0 9 Best Language: 0 10 Dysarthria: 0 11 Extinct. and Inatten.: 0 TOTAL: 0   ASSESSMENT/PLAN  Eric Jackson is a 73 y.o. male with history of hypertension, hyperlipidemia, tobacco abuse and CAD admitted for acute onset left leg weakness.  Patient is visiting from [place] for a wedding.  He was found on MRI to have a right thalamic capsular infarct.  Physical therapy is currently recommending outpatient therapy, and  given this, patient should be able to return home on Friday as scheduled.  He was instructed to see his primary care provider and obtain follow-up with a local neurologist.  NIH on Admission 2  Stroke: Right thalamocapsular infarct, etiology: Small vessel disease CT head No acute abnormality. Small vessel disease. Atrophy.  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 Therapy recommendations:  Outpatient PT/OT/ST Disposition: Pending, likely home  Hypertensive urgency Home meds: None Unstable, still high Gradually normalize BP in 3-5 days Increase amlodipine from 5 to 10 mg daily Long term BP goal normotensive   Hyperlipidemia Home meds: None LDL 225, goal < 70 Patient has tried atorvastatin in the past and experienced muscle aches, will switch to rosuvastatin 40 mg daily Continue statin at discharge  Other Stroke Risk Factors Coronary artery disease Advanced age   Other Active Problems AKI Cre 1.45--1.28--1.59, on gentle IVF, encourage po intake  Hospital day # 1  Patient seen by NP and then by MD, MD to edit note as needed. Eric Jackson Eric Jackson , MSN, AGACNP-BC [facility] See Amion for schedule and pager information 12/04/2023 2:45 PM  ATTENDING NOTE: I reviewed above note and agree with the assessment and plan. Pt was seen and examined.   OT at the bedside. Pt doing well, sit at the edge of bed. BP still on the high end, increase amlodipine to 10mg . On DAPT and statin, educated again on stroke risk factor modification. Pt lives in [place], will need PCP and neuro follow up over there.   For detailed assessment and plan, please refer to above as I have made changes wherever appropriate.   Neurology will sign off. Please call with questions. Pt will follow up with neurology locally in [place]. Thanks for the consult.   Eric Cummins, MD PhD Stroke Neurology 12/04/2023 5:44 PM    To contact Stroke Continuity provider, please refer to [email or web address]. After hours, contact General Neurology

## 2023-12-04 NOTE — Progress Notes (Signed)
 Physical Therapy Treatment Patient Details Name: Eric Jackson MRN: [id number] DOB: Jun 08, 1950 Today's Date: 12/04/2023   History of Present Illness Eric Jackson is a 73 y.o. male admitted 10/14  with weakness both legs which progressed to numbness/weakness left LE per pt. MRI brain: 1.5 cm infarct in the right thalamocapsular region.  Underlying age-related atrophy and moderate chronic microvascular ischemic disease with few scattered remote lacunar infarcts.  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 Pt reports improving LLE sensation and weakness. Pt able to tolerate increased gait distance, stair trial, and BLE exercises with up to min A due to impaired balance. Pt experienced a LOB with head turns during ambulation requiring assist to correct. Pt demonstrates decreased foot clearance and step length with increased fatigue. Pt reports that he can stay here with his son initially before returning to [place] and have 24/7 supervision. Pt instructed in BLE and balance exercises to perform at discharge for improved strength and stability. Pt continues to benefit from PT services to progress toward functional mobility goals.     If plan is discharge home, recommend the following: A little help with walking and/or transfers;A little help with bathing/dressing/bathroom;Assistance with cooking/housework;Assist for transportation;Help with stairs or ramp for entrance   Can travel by private vehicle        Equipment Recommendations  Rolling walker (2 wheels)    Recommendations for Other Services       Precautions / Restrictions Precautions Precautions: Fall Restrictions Weight Bearing Restrictions Per Provider Order: No     Mobility  Bed Mobility Overal bed mobility: Independent                   Transfers Overall transfer level: Needs assistance Equipment used: Rolling walker (2 wheels) Transfers: Sit to/from Stand Sit to Stand: Contact guard assist, Supervision           General transfer comment: Cues for hand placement and CGA- close supervision for safety due to imparied balance    Ambulation/Gait Ambulation/Gait assistance: Min assist, Contact guard assist Gait Distance (Feet): 100 Feet Assistive device: Rolling walker (2 wheels) Gait Pattern/deviations: Decreased step length - left, Decreased stride length, Decreased dorsiflexion - left, Drifts right/left       General Gait Details: Pt demonstrates L low foot clearance and decreased step length, especially with fatigue. Pt demonstrates LOB with head turn requiring assist to correct. Instability requiring CGA for safety   Stairs Stairs: Yes Stairs assistance: Contact guard assist Stair Management: Two rails, Alternating pattern, Forwards Number of Stairs: 2 (x2) General stair comments: Pt instructed in sequencing and is able to complete with CGA for safety and reliance on rails for stability   Wheelchair Mobility     Tilt Bed    Modified Rankin (Stroke Patients Only) Modified Rankin (Stroke Patients Only) Pre-Morbid Rankin Score: No symptoms Modified Rankin: Moderately severe disability     Balance Overall balance assessment: Needs assistance Sitting-balance support: No upper extremity supported, Feet supported Sitting balance-Leahy Scale: Good Sitting balance - Comments: EOB   Standing balance support: Bilateral upper extremity supported, During functional activity, Reliant on assistive device for balance Standing balance-Leahy Scale: Poor Standing balance comment: relies on RW for support and intermittent assist for LOB  Communication Communication Communication: No apparent difficulties  Cognition Arousal: Alert Behavior During Therapy: WFL  for tasks assessed/performed   PT - Cognitive impairments: No apparent impairments                         Following commands: Intact      Cueing Cueing Techniques: Tactile cues, Verbal cues  Exercises General Exercises - Lower Extremity Long Arc Quad: AROM, Seated, Left, 5 reps Other Exercises Other Exercises: x5 serial STS without UE support Other Exercises: static standing marches without UE support and CGA for safety Other Exercises: LLE step ups x10    General Comments        Pertinent Vitals/Pain Pain Assessment Pain Assessment: No/denies pain     PT Goals (current goals can now be found in the care plan section) Acute Rehab PT Goals Patient Stated Goal: to get home to [facility] PT Goal Formulation: With patient Time For Goal Achievement: 12/17/23 Progress towards PT goals: Progressing toward goals    Frequency    Min 2X/week       AM-PAC PT 6 Clicks Mobility   Outcome Measure  Help needed turning from your back to your side while in a flat bed without using bedrails?: None Help needed moving from lying on your back to sitting on the side of a flat bed without using bedrails?: None Help needed moving to and from a bed to a chair (including a wheelchair)?: A Little Help needed standing up from a chair using your arms (e.g., wheelchair or bedside chair)?: A Little Help needed to walk in hospital room?: A Little Help needed climbing 3-5 steps with a railing? : A Little 6 Click Score: 20    End of Session Equipment Utilized During Treatment: Gait belt Activity Tolerance: Patient tolerated treatment well Patient left: in chair;with chair alarm set;with call bell/phone within reach Nurse Communication: Mobility status PT Visit Diagnosis: Muscle weakness (generalized) (M62.81)     Time: 9162-9142 PT Time Calculation (min) (ACUTE ONLY): 20 min  Charges:    $Gait Training: 8-22 mins PT General Charges $$ ACUTE PT VISIT: 1 Visit                      Darryle George, PTA Acute Rehabilitation Services Secure Chat Preferred  [phone]    Darryle George 12/04/2023, 9:53 AM

## 2023-12-04 NOTE — Evaluation (Signed)
 Occupational Therapy Evaluation Patient Details Name: Eric Jackson MRN: [id number] DOB: April 18, 1950 Today's Date: 12/04/2023   History of Present Illness   Eric Jackson is a 73 y.o. male admitted 10/14  with weakness both legs which progressed to numbness/weakness left LE per pt. MRI brain: 1.5 cm infarct in the right thalamocapsular region.  Underlying age-related atrophy and moderate chronic microvascular ischemic disease with few scattered remote lacunar infarcts.  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 He is very active and walks several miles a day, retired from [employer]. Pt today presents with functional LUE (non-dominant hand) but it does have sensory deficits. Educated Pt on safety from sharp objects, situations where he could burn his hand/arm. Pt verbalized understanding. Pt able to complete LB dressing, standing grooming at sink including fine motor control and squeezing, hallway mobility. Pt reports that he does not drive at baseline due to low vision. He does not feel there have been vision changes. At this time recommend continued OT acutely to focus on safety and fall risk prevention for BALANCE with ADL focus, but do not anticipate the need for post acute OT at this time.      If plan is discharge home, recommend the following:   A little help with walking and/or transfers;A little help with bathing/dressing/bathroom;Assistance with cooking/housework;Assist for transportation     Functional Status Assessment   Patient has had a recent decline in their functional status and demonstrates the ability to make significant improvements in function in a reasonable and predictable amount of time.     Equipment Recommendations   Tub/shower seat     Recommendations for Other  Services   PT consult     Precautions/Restrictions   Precautions Precautions: Fall Restrictions Weight Bearing Restrictions Per Provider Order: No     Mobility Bed Mobility Overal bed mobility: Independent                  Transfers Overall transfer level: Needs assistance Equipment used: Rolling walker (2 wheels) Transfers: Sit to/from Stand Sit to Stand: Contact guard assist           General transfer comment: Cues for hand placement and CGA- close supervision for safety due to imparied balance      Balance Overall balance assessment: Needs assistance Sitting-balance support: No upper extremity supported, Feet supported Sitting balance-Leahy Scale: Good Sitting balance - Comments: EOB   Standing balance support: Bilateral upper extremity supported, During functional activity, Reliant on assistive device for balance Standing balance-Leahy Scale: Poor Standing balance comment: relies on RW for support and intermittent assist for LOB                           ADL either performed or assessed with clinical judgement   ADL Overall ADL's : Needs assistance/impaired Eating/Feeding: Modified independent   Grooming: Wash/dry face;Oral care;Contact guard assist;Standing Grooming Details (indicate cue type and reason): balance biggest deficit, not LUE Upper Body Bathing: Contact guard assist   Lower Body Bathing: Supervison/ safety;Sitting/lateral leans Lower Body Bathing Details (indicate cue type and reason): too imbalanced to attempt in standing Upper Body Dressing : Modified independent;Sitting   Lower Body Dressing: Contact guard assist;Sit to/from stand   Toilet Transfer: Contact guard assist;Ambulation;Rolling walker (2 wheels)   Toileting- Clothing Manipulation and Hygiene:  Modified independent;Sitting/lateral lean       Functional mobility during ADLs: Contact guard assist;Rolling walker (2 wheels) General ADL Comments: decreased  standing balance for ADL     Vision Baseline Vision/History: 1 Wears glasses (low vision at baseline) Ability to See in Adequate Light: 1 Impaired (baseline) Patient Visual Report: No change from baseline       Perception         Praxis         Pertinent Vitals/Pain Pain Assessment Pain Assessment: No/denies pain     Extremity/Trunk Assessment Upper Extremity Assessment Upper Extremity Assessment: LUE deficits/detail LUE Deficits / Details: fine motor and gross motor intact, sensation deficits - Pt describes like being in a wet suit and someone touching him through the wet suit. LUE Sensation: decreased light touch LUE Coordination: WNL   Lower Extremity Assessment Lower Extremity Assessment: Defer to PT evaluation   Cervical / Trunk Assessment Cervical / Trunk Assessment: Normal   Communication Communication Communication: No apparent difficulties   Cognition Arousal: Alert Behavior During Therapy: WFL for tasks assessed/performed Cognition: No apparent impairments                               Following commands: Intact       Cueing  General Comments   Cueing Techniques: Tactile cues;Verbal cues      Exercises     Shoulder Instructions      Home Living Family/patient expects to be discharged to:: Private residence Living Arrangements: Spouse/significant other;Other (Comment) (GF [name]) Available Help at Discharge: Family;Available PRN/intermittently (GF works 430 am - 2 pm; some family in [place] that can help) Type of Home: House Home Access: Stairs to enter [employer] of Steps: 5 Entrance Stairs-Rails: Right;Left;Can reach both Home Layout: One level     Bathroom Shower/Tub: Chief Strategy Officer: Standard     Home Equipment: None   Additional Comments: Pt is from [place] and was here for wedding and plans to fly back on Friday. Possibly has a cane here in [place].  Doesnt drive due to low vision  per pt      Prior Functioning/Environment Prior Level of Function : Independent/Modified Independent (low vision so [name] drives)             Mobility Comments: I PTA; walked 4-5 miles day ADLs Comments: B/D self    OT Problem List: Decreased strength;Decreased activity tolerance;Impaired balance (sitting and/or standing);Decreased safety awareness;Impaired sensation   OT Treatment/Interventions: Self-care/ADL training;Therapeutic exercise;DME and/or AE instruction;Therapeutic activities;Patient/family education;Balance training      OT Goals(Current goals can be found in the care plan section)   Acute Rehab OT Goals Patient Stated Goal: get back to walking like my old self OT Goal Formulation: With patient Time For Goal Achievement: 12/18/23 Potential to Achieve Goals: Good ADL Goals Pt Will Perform Grooming: with modified independence;standing Pt Will Perform Upper Body Dressing: Independently;sitting Pt Will Perform Lower Body Dressing: with modified independence;sit to/from stand Pt Will Transfer to Toilet: with modified independence;ambulating Pt Will Perform Toileting - Clothing Manipulation and hygiene: with modified independence;sit to/from stand Additional ADL Goal #1: Pt will verbalize at least three strategies for fall prevention in the home during ADL with no cues   OT Frequency:  Min 2X/week    Co-evaluation              AM-PAC OT 6 Clicks Daily Activity     Outcome Measure  Help from another person eating meals?: None Help from another person taking care of personal grooming?: A Little Help from another person toileting, which includes using toliet, bedpan, or urinal?: A Little Help from another person bathing (including washing, rinsing, drying)?: A Little Help from another person to put on and taking off regular upper body clothing?: None Help from another person to put on and taking off regular lower body clothing?: A Little 6 Click Score:  20   End of Session Equipment Utilized During Treatment: Gait belt;Rolling walker (2 wheels) Nurse Communication: Mobility status;Precautions  Activity Tolerance: Patient tolerated treatment well Patient left: in bed;with family/visitor present (MD in room)  OT Visit Diagnosis: Unsteadiness on feet (R26.81);Muscle weakness (generalized) (M62.81);Other abnormalities of gait and mobility (R26.89);Other symptoms and signs involving the nervous system (R29.898)                Time: 1130-1159 OT Time Calculation (min): 29 min Charges:  OT General Charges $OT Visit: 1 Visit OT Evaluation $OT Eval Moderate Complexity: 1 Mod OT Treatments $Self Care/Home Management : 8-22 mins Leita DEL OTR/L Acute Rehabilitation Services Office: [phone]  Leita JINNY Odea 12/04/2023, 1:35 PM

## 2023-12-04 NOTE — Progress Notes (Signed)
 [facility]                                                                               Eric Jackson, is a 73 y.o. male, DOB - 1950-04-08, FMW:[id number] Admit date - 12/02/2023    Outpatient Primary MD for the patient is Patient, No Pcp Per  LOS - 1  days    Brief summary   Eric Jackson is a 73 y.o. male with medical history significant of significant for MI with CAD status post stenting 1990, paroxysmal atrial fibrillation , hyperlipidemia, morbid obesity, and former smoker initially presented to [facility] for concern for bilateral lower extremity weakness and difficulty ambulating.   He was admitted for stroke work up .  Neurology on board.   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  Neurology on board and started the patient on aspirin and Plavix to continue for 3 weeks followed by aspirin alone Therapy evaluations recommending outpatient follow-up.   Essential hypertension blood pressure parameters are still very high Restarted the patient on amlodipine slowly increased to 10 mg and added Coreg 6.25 mg twice daily.   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   Hyperlipidemia LDL is 225 plan to discharge the patient on Crestor 40 mg daily.  History of coronary artery disease Currently chest pain free.  Continue with aspirin.    Estimated body mass index is 29.84 kg/m as calculated from the following:   Height as of this encounter: 6' (1.829 m).   Weight as of this encounter: 99.8 kg.  Code Status: full code.  DVT Prophylaxis:  enoxaparin (LOVENOX) injection 40 mg Start: 12/03/23 1000 SCD's Start: 12/03/23 0442   Level of Care: Level of care:  Progressive Family Communication: Updated patient's family at bedside.   Disposition Plan:     Remains inpatient appropriate:  home when BP is better controlled.   Procedures:  echo  Consultants:   Neurology.   Antimicrobials:   Anti-infectives (From admission, onward)    None        Medications  Scheduled Meds:  amLODipine  10 mg Oral Daily   aspirin EC  81 mg Oral Daily   carvedilol  6.25 mg Oral BID WC   clopidogrel  75 mg Oral Daily   enoxaparin (LOVENOX) injection  40 mg Subcutaneous Q24H   rosuvastatin  40 mg Oral Daily   Continuous Infusions: PRN Meds:.acetaminophen **OR** acetaminophen (TYLENOL) oral liquid 160 mg/5 mL **OR** acetaminophen, hydrALAZINE, Influenza vac split trivalent PF, pneumococcal 20-valent conjugate vaccine, senna-docusate    Subjective:   Eric Jackson was seen and examined today.  Slightly nauseated.   Objective:   Vitals:   12/04/23 0331 12/04/23 0800 12/04/23 1228 12/04/23 1345  BP: (!) 152/112 (!) 193/109 (!) 188/109 (!) 173/102  Pulse: 98 83 81   Resp:   20   Temp: 98.3 F (36.8 C) 98 F (36.7 C)  98.4 F (36.9 C)   TempSrc: Oral  Oral   SpO2: 97% 92% 97%   Weight:      Height:        Intake/Output Summary (Last 24 hours) at 12/04/2023 1542 Last data filed at 12/04/2023 0800 Gross per 24 hour  Intake --  Output 550 ml  Net -550 ml   Filed Weights   12/03/23 0053  Weight: 99.8 kg     Exam General: Alert and oriented x 3, NAD Cardiovascular: S1 S2 auscultated, no murmurs, RRR Respiratory: Clear to auscultation bilaterally, no wheezing, rales or rhonchi Gastrointestinal: Soft, nontender, nondistended, + bowel sounds Ext: no pedal edema bilaterally Neuro: AAOx3, left sided weakness improving.  Skin: No rashes Psych: Normal affect and demeanor, alert and oriented x3    Data Reviewed:  I have personally reviewed following labs and imaging studies   CBC Lab Results  Component Value Date   WBC 9.9 12/03/2023    RBC 5.66 12/03/2023   HGB 16.6 12/03/2023   HCT 50.0 12/03/2023   MCV 88.3 12/03/2023   MCH 29.3 12/03/2023   PLT 257 12/03/2023   MCHC 33.2 12/03/2023   RDW 13.2 12/03/2023   LYMPHSABS 2.0 12/02/2023   MONOABS 0.8 12/02/2023   EOSABS 0.3 12/02/2023   BASOSABS 0.0 12/02/2023     Last metabolic panel Lab Results  Component Value Date   NA 136 12/03/2023   K 3.7 12/03/2023   CL 104 12/03/2023   CO2 16 (L) 12/03/2023   BUN 14 12/03/2023   CREATININE 1.59 (H) 12/04/2023   GLUCOSE 124 (H) 12/03/2023   GFRNONAA 46 (L) 12/04/2023   CALCIUM 9.3 12/03/2023   PROT 7.4 12/02/2023   ALBUMIN 4.4 12/02/2023   BILITOT 0.7 12/02/2023   ALKPHOS 78 12/02/2023   AST 34 12/02/2023   ALT 44 12/02/2023   ANIONGAP 16 (H) 12/03/2023    CBG (last 3)  No results for input(s): GLUCAP in the last 72 hours.    Coagulation Profile: No results for input(s): INR, PROTIME in the last 168 hours.   Radiology Studies: ECHOCARDIOGRAM COMPLETE Result Date: 12/03/2023    ECHOCARDIOGRAM REPORT   Patient Name:   Eric Jackson Date of Exam: 12/03/2023 Medical Rec #:  [id number]     Height:       72.0 in Accession #:    [id number]    Weight:       220.0 lb Date of Birth:  July 20, 1950     BSA:          2.219 m Patient Age:    73 years      BP:           172/95 mmHg Patient Gender: M             HR:           87 bpm. Exam Location:  Inpatient Procedure: 2D Echo, Cardiac Doppler and Color Doppler (Both Spectral and Color            Flow Doppler were utilized during procedure). Indications:    Stroke  History:        Patient has no prior history of Echocardiogram examinations.                 CAD; Risk Factors:Hypertension.  Sonographer:    Eric Jackson Referring Phys: [id number] SUBRINA SUNDIL IMPRESSIONS  1. Left ventricular ejection fraction, by estimation, is 60 to 65%. The left ventricle has normal function. The left ventricle has  no regional wall motion abnormalities. Left ventricular diastolic parameters  are consistent with Grade I diastolic dysfunction (impaired relaxation).  2. Right ventricular systolic function is normal. The right ventricular size is normal.  3. The mitral valve is normal in structure. Trivial mitral valve regurgitation. No evidence of mitral stenosis.  4. The aortic valve has an indeterminant number of cusps. Aortic valve regurgitation is not visualized. No aortic stenosis is present. Comparison(s): No prior Echocardiogram. FINDINGS  Left Ventricle: Left ventricular ejection fraction, by estimation, is 60 to 65%. The left ventricle has normal function. The left ventricle has no regional wall motion abnormalities. The left ventricular internal cavity size was normal in size. There is  borderline left ventricular hypertrophy. Left ventricular diastolic parameters are consistent with Grade I diastolic dysfunction (impaired relaxation). Right Ventricle: The right ventricular size is normal. Right ventricular systolic function is normal. Left Atrium: Left atrial size was normal in size. Right Atrium: Right atrial size was normal in size. Pericardium: There is no evidence of pericardial effusion. Mitral Valve: The mitral valve is normal in structure. Mild mitral annular calcification. Trivial mitral valve regurgitation. No evidence of mitral valve stenosis. Tricuspid Valve: The tricuspid valve is grossly normal. Tricuspid valve regurgitation is trivial. No evidence of tricuspid stenosis. Aortic Valve: The aortic valve has an indeterminant number of cusps. Aortic valve regurgitation is not visualized. No aortic stenosis is present. Aortic valve mean gradient measures 5.0 mmHg. Aortic valve peak gradient measures 8.1 mmHg. Aortic valve area, by VTI measures 2.47 cm. Pulmonic Valve: The pulmonic valve was not well visualized. Pulmonic valve regurgitation is not visualized. No evidence of pulmonic stenosis. Aorta: The aortic root is normal in size and structure. Venous: The inferior vena cava was not  well visualized. IAS/Shunts: The interatrial septum was not well visualized.  LEFT VENTRICLE PLAX 2D LVIDd:         4.31 cm   Diastology LVIDs:         2.97 cm   LV e' medial:    4.68 cm/s LV PW:         0.98 cm   LV E/e' medial:  6.9 LV IVS:        0.96 cm   LV e' lateral:   11.60 cm/s LVOT diam:     2.03 cm   LV E/e' lateral: 2.8 LV SV:         66 LV SV Index:   30 LVOT Area:     3.24 cm  RIGHT VENTRICLE RV S prime:     13.90 cm/s TAPSE (M-mode): 2.9 cm LEFT ATRIUM             Index        RIGHT ATRIUM           Index LA Vol (A2C):   43.1 ml 19.43 ml/m  RA Area:     14.40 cm LA Vol (A4C):   43.1 ml 19.43 ml/m  RA Volume:   33.10 ml  14.92 ml/m LA Biplane Vol: 46.0 ml 20.73 ml/m  AORTIC VALVE AV Area (Vmax):    2.64 cm AV Area (Vmean):   2.57 cm AV Area (VTI):     2.47 cm AV Vmax:           142.00 cm/s AV Vmean:          100.000 cm/s AV VTI:            0.267 m AV Peak Grad:      8.1  mmHg AV Mean Grad:      5.0 mmHg LVOT Vmax:         116.00 cm/s LVOT Vmean:        79.500 cm/s LVOT VTI:          0.204 m LVOT/AV VTI ratio: 0.76 MITRAL VALVE MV Area (PHT): 4.71 cm     SHUNTS MV Decel Time: 161 msec     Systemic VTI:  0.20 m MV E velocity: 32.50 cm/s   Systemic Diam: 2.03 cm MV A velocity: 106.00 cm/s MV E/A ratio:  0.31 Redell Shallow MD Electronically signed by Redell Shallow MD Signature Date/Time: 12/03/2023/2:31:12 PM    Final    MR Brain W and Wo Contrast Result Date: 12/03/2023 EXAM: MRI BRAIN WITH AND WITHOUT CONTRAST 12/03/2023 02:14:00 AM TECHNIQUE: Multiplanar multisequence MRI of the head/brain was performed with and without the administration of 10 mL gadobutrol (GADAVIST) 1 MMOL/ML injection. COMPARISON: Comparison made with prior CT from 12/02/2023. CLINICAL HISTORY: Brain/CNS neoplasm, monitor. FINDINGS: BRAIN AND VENTRICLES: Mild age related cerebral atrophy. Patchy T2/FLAIR hyperintensity involving the periventricular and deep white matter, consistent with chronic small vessel ischemic  disease, moderate in nature. Mild patchy involvement of the pons. Few scattered remote lacunar infarcts present about the hemispheres cerebral white matter, deep gray nuclei, and right cerebellum. 1.5 cm acute ischemic nonhemorrhagic infarct seen involving the right thalamocapsular region (series 5, image 80). No associated hemorrhage or mass effect. No other evidence for acute or subacute ischemia. No acute intracranial hemorrhage. Scattered siderosis noted about the cerebellum, suggesting prior subarachnoid hemorrhage. Chronic microhemorrhage noted within the right occipital lobe. 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 No mass effect or midline shift. No hydrocephalus. No abnormal enhancement. Hypoplastic right vertebral artery not well seen. Major intracranial vessel flow voids are otherwise maintained. ORBITS: Globes and orbital soft tissues within normal limits. SINUSES: Paranasal sinuses are largely clear. No significant mastoid effusion. BONES AND SOFT TISSUES: Bone marrow signal intensity within normal limits. No scalp soft tissue abnormality. IMPRESSION: 1. 1.5 cm acute ischemic nonhemorrhagic infarct involving the right thalamocapsular region. 2. Underlying age-related atrophy with moderate chronic microvascular ischemic disease, with a few scattered remote lacunar infarcts as above. 3. Scattered siderosis about the cerebellum, suggesting prior subarachnoid hemorrhage. Electronically signed by: Morene Hoard MD 12/03/2023 03:10 AM EDT RP Workstation: [id number]   CT ANGIO HEAD NECK W WO CM Result Date: 12/02/2023 EXAM: CTA Head and Neck with Intravenous Contrast. CT Head without Contrast. CLINICAL HISTORY: Stroke/TIA, determine embolic source; left sided discoordination/mild weakness. Pt is here due to fall pta while getting out of car. Did not hit his head, no LOC. Pt felt like he  was having a little bit of weakness in his legs since he woke up this am. Pt noted left leg felt a little weaker while walking down stairs after fall around 7pm but both legs are feeling a little weak. No focal weakness in triage. TECHNIQUE: Axial CTA images of the head and neck performed with intravenous contrast. MIP reconstructed images were created and reviewed. Axial computed tomography images of the head/brain performed without intravenous contrast. Note: Per PQRS, the description of internal carotid artery percent stenosis, including 0 percent or normal exam, is based on [place] American Symptomatic Carotid Endarterectomy Trial (NASCET) criteria. Dose reduction technique was used including one or more of the following: automated exposure control, adjustment of mA and kV  according to patient size, and/or iterative reconstruction. CONTRAST: Without and with; 75 mL (iohexol (OMNIPAQUE) 350 MG/ML injection 75 mL IOHEXOL 350 MG/ML SOLN). COMPARISON: None provided. FINDINGS: CT HEAD: BRAIN: Generalized age related atrophy with moderate chronic microvascular ischemic disease. Few small remote lacunar infarcts about the right basal ganglia and right thalamus. No acute cortically based infarct. No acute intracranial hemorrhage. No mass lesion or midline shift. No high surface air access or fluid collection. No abnormal hyperdense vessel. Calcified atherosclerosis about the skull base. Scalp soft tissues in calvarium within normal limits. Left gaze preference noted. VENTRICLES: No hydrocephalus. ORBITS: The orbits are unremarkable. SINUSES AND MASTOIDS: The paranasal sinuses and mastoid air cells are largely clear. CTA NECK: COMMON CAROTID ARTERIES: Right common carotid artery is patent without dissection. Scattered atheromatous change about the right carotid artery system without hemodynamically significant greater than 50% stenosis. Left common carotid artery is patent without dissection. Scattered atheromatous change  about the left carotid artery system without hemodynamically significant greater than 50% stenosis. INTERNAL CAROTID ARTERIES: Right internal carotid artery is patent without dissection. Scattered atheromatous change about the right carotid artery system without hemodynamically significant greater than 50% stenosis. Left internal carotid artery is patent without dissection. Scattered atheromatous change about the left carotid artery system without hemodynamically significant greater than 50% stenosis. VERTEBRAL ARTERIES: Both vertebral arteries are absent subclavian arteries. No proximal subclavian artery stenosis. Strongly dominant left vertebral artery with a diffusely hypoplastic right vertebral artery. Mild atheromatous change about the vertebral arteries without significant stenosis. No dissection. OTHER: No worrisome osseous lesions. Poor dentition noted. No other acute abnormality within the neck. Visualized aortic arch with normal limits for caliber. Bovine branching pattern noted. Moderate aortic atherosclerosis. No significant stenosis about the origin of the great vessels. Visualized upper chest demonstrates no acute finding. CTA HEAD: ANTERIOR CEREBRAL ARTERIES: Atheromatous change about the carotid siphons without hemodynamically significant stenosis. A1 segments patent bilaterally. Normal anterior carotid complex. Atheromatous change about the ACAs with associated moderate to severe distal A3 stenoses (series 15, image 27). MIDDLE CEREBRAL ARTERIES: Atheromatous change about the M1 segment bilaterally. Associated mild stenosis at the proximal right M1 segment, with additional mild stenosis at the distal left M1 segment (series 11, image 23). No proximal MC branch occlusion. Distal MC branch is perfused and symmetric, although demonstrate small vessel atherosclerotic irregularity. POSTERIOR CEREBRAL ARTERIES: Dominant left V4 segment patent in the vertebral basilar junction without stenosis. Left PICA  patent. Right vertebral artery markedly diminutive and attenuated as it crosses into the cranial vault, but remains grossly patent to the takeoff of the right PICA. Right vertebral artery occludes distally. Right PICA grossly patent. Basilar patent without stenosis. Superior cerebellar arteries patent bilaterally. Both PCAs primarily supplied by the basilar. Atheromatous change about the PCAs bilaterally with associated moderate left P2 stenosis (series 11, image 22). PCAs remain patent otherwise. BASILAR ARTERY: Basilar patent without stenosis. OTHER: No intracranial aneurysm. SOFT TISSUES: No acute finding. No masses or lymphadenopathy. BONES: No acute osseous abnormality. IMPRESSION: 1. No acute intracranial abnormality. 2. Age-related cerebral atrophy with moderate chronic bifrontal ischemic disease. 3. Negative CTA for acute large vessel occlusion or other emergent finding. 4. 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 Dominant left vertebral artery patent without significant stenosis. 5. Moderate intracranial atherosclerosis, with most notable findings including moderate to severe distal A3 stenoses moderate left P2 stenosis, with mild bilateral M1 stenoses. Electronically signed by: Morene Hoard MD 12/02/2023 10:50 PM EDT RP  Workstation: [id number]   DG Chest Portable 1 View Result Date: 12/02/2023 CLINICAL DATA:  Hypertension, leg weakness.  Fall. EXAM: PORTABLE CHEST 1 VIEW COMPARISON:  None available FINDINGS: Heart and mediastinal contours are within normal limits. No focal opacities or effusions. No acute bony abnormality. No pneumothorax. IMPRESSION: No active disease. Electronically Signed   By: Franky Crease M.D.   On: 12/02/2023 21:31       Elgie Butter M.D. [facility] 12/04/2023, 3:42 PM  Available via Epic secure chat 7am-7pm After 7 pm, please refer to night coverage provider listed on amion.

## 2023-12-05 ENCOUNTER — Other Ambulatory Visit (HOSPITAL_COMMUNITY): Payer: Self-pay

## 2023-12-05 DIAGNOSIS — Z7901 Long term (current) use of anticoagulants: Secondary | ICD-10-CM

## 2023-12-05 DIAGNOSIS — R29700 NIHSS score 0: Secondary | ICD-10-CM | POA: Diagnosis not present

## 2023-12-05 DIAGNOSIS — R531 Weakness: Secondary | ICD-10-CM | POA: Diagnosis not present

## 2023-12-05 DIAGNOSIS — I160 Hypertensive urgency: Secondary | ICD-10-CM | POA: Diagnosis not present

## 2023-12-05 DIAGNOSIS — I480 Paroxysmal atrial fibrillation: Secondary | ICD-10-CM

## 2023-12-05 DIAGNOSIS — I739 Peripheral vascular disease, unspecified: Secondary | ICD-10-CM | POA: Diagnosis not present

## 2023-12-05 DIAGNOSIS — I639 Cerebral infarction, unspecified: Secondary | ICD-10-CM | POA: Diagnosis not present

## 2023-12-05 DIAGNOSIS — E782 Mixed hyperlipidemia: Secondary | ICD-10-CM | POA: Diagnosis not present

## 2023-12-05 DIAGNOSIS — I6389 Other cerebral infarction: Secondary | ICD-10-CM | POA: Diagnosis not present

## 2023-12-05 LAB — BASIC METABOLIC PANEL WITH GFR
Anion gap: 12 (ref 5–15)
BUN: 21 mg/dL (ref 8–23)
CO2: 20 mmol/L — ABNORMAL LOW (ref 22–32)
Calcium: 8.9 mg/dL (ref 8.9–10.3)
Chloride: 101 mmol/L (ref 98–111)
Creatinine, Ser: 1.56 mg/dL — ABNORMAL HIGH (ref 0.61–1.24)
GFR, Estimated: 47 mL/min — ABNORMAL LOW (ref >60)
Glucose, Bld: 95 mg/dL (ref 70–99)
Potassium: 4 mmol/L (ref 3.5–5.1)
Sodium: 133 mmol/L — ABNORMAL LOW (ref 135–145)

## 2023-12-05 MED ORDER — PNEUMOCOCCAL 20-VAL CONJ VACC 0.5 ML IM SUSY: 0.5000 mL | PREFILLED_SYRINGE | INTRAMUSCULAR | Status: DC

## 2023-12-05 MED ORDER — CARVEDILOL 6.25 MG PO TABS
6.2500 mg | ORAL_TABLET | Freq: Two times a day (BID) | ORAL | 3 refills | Status: AC
  Filled 2023-12-05: qty 60, 30d supply, fill #0

## 2023-12-05 MED ORDER — INFLUENZA VAC SPLIT HIGH-DOSE 0.5 ML IM SUSY: 0.5000 mL | PREFILLED_SYRINGE | INTRAMUSCULAR | Status: DC

## 2023-12-05 MED ORDER — APIXABAN 5 MG PO TABS
5.0000 mg | ORAL_TABLET | Freq: Two times a day (BID) | ORAL | 12 refills | Status: AC
  Filled 2023-12-05: qty 60, 30d supply, fill #0

## 2023-12-05 MED ADMIN — Apixaban Tab 5 MG: 5 mg | ORAL | NDC 00003089431

## 2023-12-05 MED FILL — Apixaban Tab 5 MG: 5.0000 mg | ORAL | Qty: 1 | Status: AC

## 2023-12-05 NOTE — TOC Transition Note (Signed)
 Transition of Care [facility]) - Discharge Note   Patient Details  Name: Eric Jackson MRN: [id number] Date of Birth: 1950-07-31  [facility] CM/SW Contact:  Andrez JULIANNA George, RN Phone Number: 12/05/2023, 10:43 AM   Clinical Narrative:     Pt discharging to daughters home and then back to [place]. Outpatient therapy referral sent to [facility] They will contact the patient for the first appointment.  Walker delivered to the room for home.  Pt has transportation home.  Final next level of care: OP Rehab Barriers to Discharge: No Barriers Identified   Patient Goals and CMS Choice     Choice offered to / list presented to : Patient      Discharge Placement                       Discharge Plan and Services Additional resources added to the After Visit Summary for     Discharge Planning Services: CM Consult                                 Social Drivers of Health (SDOH) Interventions SDOH Screenings   Food Insecurity: No Food Insecurity (12/03/2023)  Housing: Low Risk  (12/03/2023)  Transportation Needs: No Transportation Needs (12/03/2023)  Utilities: Not At Risk (12/03/2023)  Social Connections: Moderately Isolated (12/03/2023)  Tobacco Use: Medium Risk (12/03/2023)     Readmission Risk Interventions     No data to display

## 2023-12-05 NOTE — Progress Notes (Signed)
 STROKE TEAM PROGRESS NOTE    INTERIM HISTORY/SUBJECTIVE Daughter is at the bedside. Pt lying in bed, stated that he has hx of afib and was on eliquis . But since he moved to [place], he was not able to find family doctor and not get medication refilled.   OBJECTIVE  CBC    Component Value Date/Time   WBC 9.9 12/03/2023 0627   RBC 5.66 12/03/2023 0627   HGB 16.6 12/03/2023 0627   HCT 50.0 12/03/2023 0627   PLT 257 12/03/2023 0627   MCV 88.3 12/03/2023 0627   MCH 29.3 12/03/2023 0627   MCHC 33.2 12/03/2023 0627   RDW 13.2 12/03/2023 0627   LYMPHSABS 2.0 12/02/2023 2106   MONOABS 0.8 12/02/2023 2106   EOSABS 0.3 12/02/2023 2106   BASOSABS 0.0 12/02/2023 2106    BMET    Component Value Date/Time   NA 133 (L) 12/05/2023 0512   K 4.0 12/05/2023 0512   CL 101 12/05/2023 0512   CO2 20 (L) 12/05/2023 0512   GLUCOSE 95 12/05/2023 0512   BUN 21 12/05/2023 0512   CREATININE 1.56 (H) 12/05/2023 0512   CALCIUM 8.9 12/05/2023 0512   GFRNONAA 47 (L) 12/05/2023 0512    IMAGING past 24 hours No results found.   Vitals:   12/04/23 2031 12/04/23 2341 12/05/23 0334 12/05/23 0739  BP: (!) 156/81 (!) 157/87 (!) 170/93 (!) 151/88  Pulse: 71 75 78 69  Resp: 18 18 18 20   Temp: 98.2 F (36.8 C) 98.4 F (36.9 C) 98 F (36.7 C) 98.3 F (36.8 C)  TempSrc: Oral Oral Oral Oral  SpO2: 95% 95% 97% 98%  Weight:      Height:         PHYSICAL EXAM General:  Alert, well-nourished, well-developed patient in no acute distress Psych:  Mood and affect appropriate for situation CV: Regular rate and rhythm on monitor Respiratory:  Regular, unlabored respirations on room air    NEURO:  Mental Status: AA&Ox3, patient is able to give clear and coherent history Speech/Language: speech is without dysarthria or aphasia.    Cranial Nerves:  II: PERRL. Visual fields full.  III, IV, VI: EOMI. Eyelids elevate symmetrically.  V: Sensation is intact to light touch and symmetrical to face.  VII:  Face is symmetrical resting and smiling VIII: hearing intact to voice. IX, X: Palate elevates symmetrically. Phonation is normal.  KP:Dynloizm shrug 5/5. XII: tongue is midline without fasciculations. Motor: 5/5 strength to right upper and lower extremities, proximal and distal, 4+/5 to left upper and lower extremities, proximal and distal Tone: is normal and bulk is normal Sensation- Intact to light touch bilaterally. Coordination: FTN intact bilaterally Gait- deferred  Most Recent NIH  1a Level of Conscious.: 0 1b LOC Questions: 0 1c LOC Commands: 0 2 Best Gaze: 0 3 Visual: 0 4 Facial Palsy: 0 5a Motor Arm - left: 0 5b Motor Arm - Right: 0 6a Motor Leg - Left: 0 6b Motor Leg - Right: 0 7 Limb Ataxia: 0 8 Sensory: 0 9 Best Language: 0 10 Dysarthria: 0 11 Extinct. and Inatten.: 0 TOTAL: 0   ASSESSMENT/PLAN  Mr. Eric Jackson is a 73 y.o. male with history of hypertension, hyperlipidemia, tobacco abuse and CAD admitted for acute onset left leg weakness.  Patient is visiting from [place] for a wedding.  He was found on MRI to have a right thalamic capsular infarct.  Physical therapy is currently recommending outpatient therapy, and given this, patient should be able to  return home on Friday as scheduled.  He was instructed to see his primary care provider and obtain follow-up with a local neurologist.  NIH on Admission 2  Stroke: Right thalamocapsular infarct, etiology: Small vessel disease, however, he does have hx of Afib was on [facility] before CT head No acute abnormality. Small vessel disease. Atrophy.  CTA head & neck no LVO, hypoplastic right vertebral artery with occlusion beyond the takeoff of the right PICA, moderate to severe distal A3 stenosis, moderate left P2 stenosis and mild bilateral M1 stenoses MRI right thalamocapsular infarct 2D Echo EF 60-65% LDL 225 HgbA1c 5.0 VTE prophylaxis -Lovenox No antithrombotic prior to admission, was on aspirin 81 mg daily and  clopidogrel 75 mg daily, now switch to eliquis .  Therapy recommendations:  Outpatient PT/OT/ST Disposition: Pending, likely home  AF Hx of afib and was on [facility] Off AC since he moved to [place] and did not find provider to refill the meds Now put back on eliquis   Hypertensive urgency Home meds: None Unstable, still high Gradually normalize BP in 2-4 days Increase amlodipine from 5 to 10 mg daily Long term BP goal normotensive   Hyperlipidemia Home meds: None LDL 225, goal < 70 Patient has tried atorvastatin in the past and experienced muscle aches, will switch to rosuvastatin 40 mg daily Continue statin at discharge  Other Stroke Risk Factors Coronary artery disease Advanced age   Other Active Problems AKI Cre 1.45--1.28--1.59, on gentle IVF, encourage po intake  Hospital day # 2   Neurology will sign off. Please call with questions. Pt will follow up with neurology locally in [place]. Thanks for the consult.   Ary Cummins, MD PhD Stroke Neurology 12/05/2023 11:40 AM    To contact Stroke Continuity provider, please refer to [email or web address]. After hours, contact General Neurology

## 2023-12-05 NOTE — Progress Notes (Signed)
 Pt ready for DC. VSS. AVS medication and DC instructions reviewed with the patient by Angela, VeRN. All lines removed and belongings returned.

## 2023-12-05 NOTE — Progress Notes (Signed)
 Physical Therapy Treatment Patient Details Name: Eric Jackson MRN: [id number] DOB: March 21, 1950 Today's Date: 12/05/2023   History of Present Illness Eric Jackson is a 73 y.o. male admitted 10/14  with weakness both legs which progressed to numbness/weakness left LE per pt. MRI brain: 1.5 cm infarct in the right thalamocapsular region.  Underlying age-related atrophy and moderate chronic microvascular ischemic disease with few scattered remote lacunar infarcts.  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 Pt able to tolerate increased gait distance, but demonstrates increased fatigue at end of trial. Pt able to participate in BLE cone toe taps with focus on LLE coordination and stability. Pt demonstrates LLE ataxia during exercise that slightly improves with increased reps. Discussed exercises for strengthening and coordination with pt verbalizing understanding. Pt demonstrates increased instability with head turns during ambulation requiring intermittent assist for balance. Pt continues to benefit from PT services to progress toward functional mobility goals.    If plan is discharge home, recommend the following: A little help with walking and/or transfers;A little help with bathing/dressing/bathroom;Assistance with cooking/housework;Assist for transportation;Help with stairs or ramp for entrance   Can travel by private vehicle        Equipment Recommendations  Rolling walker (2 wheels)    Recommendations for Other Services       Precautions / Restrictions Precautions Precautions: Fall Restrictions Weight Bearing Restrictions Per Provider Order: No     Mobility  Bed Mobility Overal bed mobility: Independent                  Transfers Overall transfer level: Needs  assistance Equipment used: Rolling walker (2 wheels) Transfers: Sit to/from Stand Sit to Stand: Contact guard assist           General transfer comment: increased difficulty standing from low EOB, but no assist needed. cues for hand placement and CGA for safety    Ambulation/Gait Ambulation/Gait assistance: Min assist, Contact guard assist Gait Distance (Feet): 150 Feet Assistive device: Rolling walker (2 wheels) Gait Pattern/deviations: Decreased step length - left, Decreased stride length, Decreased dorsiflexion - left, Drifts right/left, Ataxic       General Gait Details: Pt demonstrates LLE ataxia, low foot clearance, and decreased step length, especially with fatigue. Pt demonstrates incrased instability with head turns requiring assist for balance.   Stairs             Wheelchair Mobility     Tilt Bed    Modified Rankin (Stroke Patients Only) Modified Rankin (Stroke Patients Only) Pre-Morbid Rankin Score: No symptoms Modified Rankin: Moderately severe disability     Balance Overall balance assessment: Needs assistance Sitting-balance support: No upper extremity supported, Feet supported Sitting balance-Leahy Scale: Good Sitting balance - Comments: EOB   Standing balance support: Bilateral upper extremity supported, During functional activity, Reliant on assistive device for balance Standing balance-Leahy Scale: Poor Standing balance comment: relies on RW for support and intermittent assist for balance                            Communication Communication Communication: No apparent difficulties  Cognition Arousal: Alert Behavior During Therapy: WFL for tasks assessed/performed   PT - Cognitive impairments: No apparent impairments  Following commands: Intact      Cueing Cueing Techniques: Tactile cues, Verbal cues  Exercises Other Exercises Other Exercises: BLE toe taps to cones progressing to  crossing midline    General Comments        Pertinent Vitals/Pain Pain Assessment Pain Assessment: No/denies pain     PT Goals (current goals can now be found in the care plan section) Acute Rehab PT Goals Patient Stated Goal: to get home to [facility] PT Goal Formulation: With patient Time For Goal Achievement: 12/17/23 Progress towards PT goals: Progressing toward goals    Frequency    Min 2X/week       AM-PAC PT 6 Clicks Mobility   Outcome Measure  Help needed turning from your back to your side while in a flat bed without using bedrails?: None Help needed moving from lying on your back to sitting on the side of a flat bed without using bedrails?: None Help needed moving to and from a bed to a chair (including a wheelchair)?: A Little Help needed standing up from a chair using your arms (e.g., wheelchair or bedside chair)?: A Little Help needed to walk in hospital room?: A Little Help needed climbing 3-5 steps with a railing? : A Little 6 Click Score: 20    End of Session Equipment Utilized During Treatment: Gait belt Activity Tolerance: Patient tolerated treatment well Patient left: in chair;with chair alarm set;with call bell/phone within reach Nurse Communication: Mobility status PT Visit Diagnosis: Muscle weakness (generalized) (M62.81)     Time: 9166-9148 PT Time Calculation (min) (ACUTE ONLY): 18 min  Charges:    $Gait Training: 8-22 mins PT General Charges $$ ACUTE PT VISIT: 1 Visit                     Darryle George, PTA Acute Rehabilitation Services Secure Chat Preferred  Office:([phone]) [phone]    Darryle George 12/05/2023, 9:17 AM

## 2023-12-09 NOTE — Discharge Summary (Signed)
 Physician Discharge Summary   Patient: Eric Jackson MRN: [id number] DOB: Jul 22, 1950  Admit date:     12/02/2023  Discharge date: 12/05/2023  Discharge Physician: Elgie Butter   PCP: Patient, No Pcp Per   Recommendations at discharge:  PLEASE FOLLOW UP WITH PCP IN ONE WEEK  PLEASE FOLLOW UP WITH NEUROLOGY AS RECOMMENDED.   Discharge Diagnoses: Principal Problem:   Acute CVA (cerebrovascular accident) [facility]) Active Problems:   Hypertensive urgency   Hyperlipidemia   Paroxysmal atrial fibrillation (HCC)   History of CAD (coronary artery disease)    Hospital Course: LYNELL Jackson is a 73 y.o. male with medical history significant of significant for MI with CAD status post stenting 1990, paroxysmal atrial fibrillation , hyperlipidemia, morbid obesity, and former smoker initially presented to [facility] department for concern for bilateral lower extremity weakness and difficulty ambulating.   He was admitted for stroke work up .  Neurology consulted and recommendations given to continue with eliquis  and follow up with neurology and cardiology in [place].   Assessment and Plan: Stroke MRI of the brain showing right thalamocapsular infarct Echocardiogram showing left ventricular ejection fraction of 60 to 65% LDL is 225, started the patient on statin Hemoglobin A1c is 5.   Patient was on eliquis  which he stopped as he couldn't get more refills at [facility]. He was discharged on eliquis  and statin and recommended to follow [facility] neurology in [place].  Therapy evaluations recommending outpatient follow-up.     Essential hypertension blood pressure parameters are still very high Restarted the patient on amlodipine slowly increased to 10 mg and added Coreg 6.25 mg twice daily.     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     Hyperlipidemia LDL is 225 plan to discharge the patient on Crestor 40 mg  daily.   History of coronary artery disease Currently chest pain free.  Continue with aspirin.    H/o PAF Rate controlled on coreg Resume eliquis  on discharge.        Consultants: neurology.  Procedures performed: echo  Disposition: Home Diet recommendation:  Discharge Diet Orders (From admission, onward)     Start     Ordered   12/05/23 0000  Diet - low sodium heart healthy        12/05/23 0928           Cardiac diet DISCHARGE MEDICATION: Allergies as of 12/05/2023   No Known Allergies      Medication List     STOP taking these medications    atorvastatin 80 MG tablet Commonly known as: LIPITOR   diltiazem  30 MG tablet Commonly known as: Cardizem        TAKE these medications    amLODipine 10 MG tablet Commonly known as: NORVASC Take 1 tablet (10 mg total) by mouth daily.   carvedilol 6.25 MG tablet Commonly known as: COREG Take 1 tablet (6.25 mg total) by mouth 2 (two) times daily with a meal.   Eliquis  5 MG Tabs tablet Generic drug: apixaban  Take 1 tablet (5 mg total) by mouth 2 (two) times daily.   rosuvastatin 40 MG tablet Commonly known as: CRESTOR Take 1 tablet (40 mg total) by mouth daily.        Follow-up Information     Elden Shuck of [address] Schedule an appointment as soon as possible for a visit.   Contact information: [address] [place], [place], [place] [address] Phone: [phone])  [phone]               Discharge Exam: Filed Weights   12/03/23 0053  Weight: 99.8 kg   General exam: Appears calm and comfortable  Respiratory system: Clear to auscultation. Respiratory effort normal. Cardiovascular system: S1 & S2 heard, RRR. No JVD,  Gastrointestinal system: Abdomen is nondistended, soft and nontender.  Central nervous system: Alert and oriented. No focal neurological deficits. Extremities: Symmetric 5 x 5 power. Skin: No rashes, lesions or ulcers Psychiatry: Mood & affect appropriate.    Condition at discharge:  fair  The results of significant diagnostics from this hospitalization (including imaging, microbiology, ancillary and laboratory) are listed below for reference.   Imaging Studies: ECHOCARDIOGRAM COMPLETE Result Date: 12/03/2023    ECHOCARDIOGRAM REPORT   Patient Name:   Eric Jackson Date of Exam: 12/03/2023 Medical Rec #:  [id number]     Height:       72.0 in Accession #:    [id number]    Weight:       220.0 lb Date of Birth:  1951/01/13     BSA:          2.219 m Patient Age:    73 years      BP:           172/95 mmHg Patient Gender: M             HR:           87 bpm. Exam Location:  Inpatient Procedure: 2D Echo, Cardiac Doppler and Color Doppler (Both Spectral and Color            Flow Doppler were utilized during procedure). Indications:    Stroke  History:        Patient has no prior history of Echocardiogram examinations.                 CAD; Risk Factors:Hypertension.  Sonographer:    Jayson Gaskins Referring Phys: [id number] SUBRINA SUNDIL IMPRESSIONS  1. Left ventricular ejection fraction, by estimation, is 60 to 65%. The left ventricle has normal function. The left ventricle has no regional wall motion abnormalities. Left ventricular diastolic parameters are consistent with Grade I diastolic dysfunction (impaired relaxation).  2. Right ventricular systolic function is normal. The right ventricular size is normal.  3. The mitral valve is normal in structure. Trivial mitral valve regurgitation. No evidence of mitral stenosis.  4. The aortic valve has an indeterminant number of cusps. Aortic valve regurgitation is not visualized. No aortic stenosis is present. Comparison(s): No prior Echocardiogram. FINDINGS  Left Ventricle: Left ventricular ejection fraction, by estimation, is 60 to 65%. The left ventricle has normal function. The left ventricle has no regional wall motion abnormalities. The left ventricular internal cavity size was normal in size. There is  borderline left ventricular hypertrophy. Left  ventricular diastolic parameters are consistent with Grade I diastolic dysfunction (impaired relaxation). Right Ventricle: The right ventricular size is normal. Right ventricular systolic function is normal. Left Atrium: Left atrial size was normal in size. Right Atrium: Right atrial size was normal in size. Pericardium: There is no evidence of pericardial effusion. Mitral Valve: The mitral valve is normal in structure. Mild mitral annular calcification. Trivial mitral valve regurgitation. No evidence of mitral valve stenosis. Tricuspid Valve: The tricuspid valve is grossly normal. Tricuspid valve regurgitation is trivial. No evidence of tricuspid stenosis. Aortic Valve: The aortic valve has an indeterminant number of cusps. Aortic valve regurgitation is not visualized. No aortic  stenosis is present. Aortic valve mean gradient measures 5.0 mmHg. Aortic valve peak gradient measures 8.1 mmHg. Aortic valve area, by VTI measures 2.47 cm. Pulmonic Valve: The pulmonic valve was not well visualized. Pulmonic valve regurgitation is not visualized. No evidence of pulmonic stenosis. Aorta: The aortic root is normal in size and structure. Venous: The inferior vena cava was not well visualized. IAS/Shunts: The interatrial septum was not well visualized.  LEFT VENTRICLE PLAX 2D LVIDd:         4.31 cm   Diastology LVIDs:         2.97 cm   LV e' medial:    4.68 cm/s LV PW:         0.98 cm   LV E/e' medial:  6.9 LV IVS:        0.96 cm   LV e' lateral:   11.60 cm/s LVOT diam:     2.03 cm   LV E/e' lateral: 2.8 LV SV:         66 LV SV Index:   30 LVOT Area:     3.24 cm  RIGHT VENTRICLE RV S prime:     13.90 cm/s TAPSE (M-mode): 2.9 cm LEFT ATRIUM             Index        RIGHT ATRIUM           Index LA Vol (A2C):   43.1 ml 19.43 ml/m  RA Area:     14.40 cm LA Vol (A4C):   43.1 ml 19.43 ml/m  RA Volume:   33.10 ml  14.92 ml/m LA Biplane Vol: 46.0 ml 20.73 ml/m  AORTIC VALVE AV Area (Vmax):    2.64 cm AV Area (Vmean):   2.57  cm AV Area (VTI):     2.47 cm AV Vmax:           142.00 cm/s AV Vmean:          100.000 cm/s AV VTI:            0.267 m AV Peak Grad:      8.1 mmHg AV Mean Grad:      5.0 mmHg LVOT Vmax:         116.00 cm/s LVOT Vmean:        79.500 cm/s LVOT VTI:          0.204 m LVOT/AV VTI ratio: 0.76 MITRAL VALVE MV Area (PHT): 4.71 cm     SHUNTS MV Decel Time: 161 msec     Systemic VTI:  0.20 m MV E velocity: 32.50 cm/s   Systemic Diam: 2.03 cm MV A velocity: 106.00 cm/s MV E/A ratio:  0.31 Redell Shallow MD Electronically signed by Redell Shallow MD Signature Date/Time: 12/03/2023/2:31:12 PM    Final    MR Brain W and Wo Contrast Result Date: 12/03/2023 EXAM: MRI BRAIN WITH AND WITHOUT CONTRAST 12/03/2023 02:14:00 AM TECHNIQUE: Multiplanar multisequence MRI of the head/brain was performed with and without the administration of 10 mL gadobutrol (GADAVIST) 1 MMOL/ML injection. COMPARISON: Comparison made with prior CT from 12/02/2023. CLINICAL HISTORY: Brain/CNS neoplasm, monitor. FINDINGS: BRAIN AND VENTRICLES: Mild age related cerebral atrophy. Patchy T2/FLAIR hyperintensity involving the periventricular and deep white matter, consistent with chronic small vessel ischemic disease, moderate in nature. Mild patchy involvement of the pons. Few scattered remote lacunar infarcts present about the hemispheres cerebral white matter, deep gray nuclei, and right cerebellum. 1.5 cm acute ischemic nonhemorrhagic infarct seen involving the right thalamocapsular  region (series 5, image 80). No associated hemorrhage or mass effect. No other evidence for acute or subacute ischemia. No acute intracranial hemorrhage. Scattered siderosis noted about the cerebellum, suggesting prior subarachnoid hemorrhage. Chronic microhemorrhage noted within the right occipital lobe. 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 No mass effect  or midline shift. No hydrocephalus. No abnormal enhancement. Hypoplastic right vertebral artery not well seen. Major intracranial vessel flow voids are otherwise maintained. ORBITS: Globes and orbital soft tissues within normal limits. SINUSES: Paranasal sinuses are largely clear. No significant mastoid effusion. BONES AND SOFT TISSUES: Bone marrow signal intensity within normal limits. No scalp soft tissue abnormality. IMPRESSION: 1. 1.5 cm acute ischemic nonhemorrhagic infarct involving the right thalamocapsular region. 2. Underlying age-related atrophy with moderate chronic microvascular ischemic disease, with a few scattered remote lacunar infarcts as above. 3. Scattered siderosis about the cerebellum, suggesting prior subarachnoid hemorrhage. Electronically signed by: Morene Hoard MD 12/03/2023 03:10 AM EDT RP Workstation: [id number]   CT ANGIO HEAD NECK W WO CM Result Date: 12/02/2023 EXAM: CTA Head and Neck with Intravenous Contrast. CT Head without Contrast. CLINICAL HISTORY: Stroke/TIA, determine embolic source; left sided discoordination/mild weakness. Pt is here due to fall pta while getting out of car. Did not hit his head, no LOC. Pt felt like he was having a little bit of weakness in his legs since he woke up this am. Pt noted left leg felt a little weaker while walking down stairs after fall around 7pm but both legs are feeling a little weak. No focal weakness in triage. TECHNIQUE: Axial CTA images of the head and neck performed with intravenous contrast. MIP reconstructed images were created and reviewed. Axial computed tomography images of the head/brain performed without intravenous contrast. Note: Per PQRS, the description of internal carotid artery percent stenosis, including 0 percent or normal exam, is based on [place] American Symptomatic Carotid Endarterectomy Trial (NASCET) criteria. Dose reduction technique was used including one or more of the following: automated exposure  control, adjustment of mA and kV according to patient size, and/or iterative reconstruction. CONTRAST: Without and with; 75 mL (iohexol (OMNIPAQUE) 350 MG/ML injection 75 mL IOHEXOL 350 MG/ML SOLN). COMPARISON: None provided. FINDINGS: CT HEAD: BRAIN: Generalized age related atrophy with moderate chronic microvascular ischemic disease. Few small remote lacunar infarcts about the right basal ganglia and right thalamus. No acute cortically based infarct. No acute intracranial hemorrhage. No mass lesion or midline shift. No high surface air access or fluid collection. No abnormal hyperdense vessel. Calcified atherosclerosis about the skull base. Scalp soft tissues in calvarium within normal limits. Left gaze preference noted. VENTRICLES: No hydrocephalus. ORBITS: The orbits are unremarkable. SINUSES AND MASTOIDS: The paranasal sinuses and mastoid air cells are largely clear. CTA NECK: COMMON CAROTID ARTERIES: Right common carotid artery is patent without dissection. Scattered atheromatous change about the right carotid artery system without hemodynamically significant greater than 50% stenosis. Left common carotid artery is patent without dissection. Scattered atheromatous change about the left carotid artery system without hemodynamically significant greater than 50% stenosis. INTERNAL CAROTID ARTERIES: Right internal carotid artery is patent without dissection. Scattered atheromatous change about the right carotid artery system without hemodynamically significant greater than 50% stenosis. Left internal carotid artery is patent without dissection. Scattered atheromatous change about the left carotid artery system without hemodynamically significant greater than 50% stenosis. VERTEBRAL ARTERIES: Both vertebral arteries are absent subclavian arteries. No proximal subclavian  artery stenosis. Strongly dominant left vertebral artery with a diffusely hypoplastic right vertebral artery. Mild atheromatous change about the  vertebral arteries without significant stenosis. No dissection. OTHER: No worrisome osseous lesions. Poor dentition noted. No other acute abnormality within the neck. Visualized aortic arch with normal limits for caliber. Bovine branching pattern noted. Moderate aortic atherosclerosis. No significant stenosis about the origin of the great vessels. Visualized upper chest demonstrates no acute finding. CTA HEAD: ANTERIOR CEREBRAL ARTERIES: Atheromatous change about the carotid siphons without hemodynamically significant stenosis. A1 segments patent bilaterally. Normal anterior carotid complex. Atheromatous change about the ACAs with associated moderate to severe distal A3 stenoses (series 15, image 27). MIDDLE CEREBRAL ARTERIES: Atheromatous change about the M1 segment bilaterally. Associated mild stenosis at the proximal right M1 segment, with additional mild stenosis at the distal left M1 segment (series 11, image 23). No proximal MC branch occlusion. Distal MC branch is perfused and symmetric, although demonstrate small vessel atherosclerotic irregularity. POSTERIOR CEREBRAL ARTERIES: Dominant left V4 segment patent in the vertebral basilar junction without stenosis. Left PICA patent. Right vertebral artery markedly diminutive and attenuated as it crosses into the cranial vault, but remains grossly patent to the takeoff of the right PICA. Right vertebral artery occludes distally. Right PICA grossly patent. Basilar patent without stenosis. Superior cerebellar arteries patent bilaterally. Both PCAs primarily supplied by the basilar. Atheromatous change about the PCAs bilaterally with associated moderate left P2 stenosis (series 11, image 22). PCAs remain patent otherwise. BASILAR ARTERY: Basilar patent without stenosis. OTHER: No intracranial aneurysm. SOFT TISSUES: No acute finding. No masses or lymphadenopathy. BONES: No acute osseous abnormality. IMPRESSION: 1. No acute intracranial abnormality. 2. Age-related  cerebral atrophy with moderate chronic bifrontal ischemic disease. 3. Negative CTA for acute large vessel occlusion or other emergent finding. 4. 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 Dominant left vertebral artery patent without significant stenosis. 5. Moderate intracranial atherosclerosis, with most notable findings including moderate to severe distal A3 stenoses moderate left P2 stenosis, with mild bilateral M1 stenoses. Electronically signed by: Morene Hoard MD 12/02/2023 10:50 PM EDT RP Workstation: [id number]   DG Chest Portable 1 View Result Date: 12/02/2023 CLINICAL DATA:  Hypertension, leg weakness.  Fall. EXAM: PORTABLE CHEST 1 VIEW COMPARISON:  None available FINDINGS: Heart and mediastinal contours are within normal limits. No focal opacities or effusions. No acute bony abnormality. No pneumothorax. IMPRESSION: No active disease. Electronically Signed   By: Franky Crease M.D.   On: 12/02/2023 21:31    Microbiology: No results found for this or any previous visit.  Labs: CBC: Recent Labs  Lab 12/02/23 2106 12/03/23 0627  WBC 9.1 9.9  NEUTROABS 6.0  --   HGB 15.3 16.6  HCT 44.9 50.0  MCV 86.7 88.3  PLT 236 257   Basic Metabolic Panel: Recent Labs  Lab 12/02/23 2106 12/03/23 0616 12/04/23 1300 12/05/23 0512  NA 135 136  --  133*  K 4.1 3.7  --  4.0  CL 101 104  --  101  CO2 23 16*  --  20*  GLUCOSE 118* 124*  --  95  BUN 18 14  --  21  CREATININE 1.45* 1.28* 1.59* 1.56*  CALCIUM 9.7 9.3  --  8.9   Liver Function Tests: Recent Labs  Lab 12/02/23 2106  AST 34  ALT 44  ALKPHOS 78  BILITOT 0.7  PROT 7.4  ALBUMIN 4.4   CBG: No results for input(s): GLUCAP in the last  168 hours.  Discharge time spent: 43 minutes.   Signed: Audi Conover, MD [facility]

## 2024-01-03 ENCOUNTER — Other Ambulatory Visit (HOSPITAL_COMMUNITY): Payer: Self-pay

## 2024-01-05 ENCOUNTER — Other Ambulatory Visit (HOSPITAL_COMMUNITY): Payer: Self-pay
